# Patient Record
Sex: Female | Born: 1975 | Race: Asian | Hispanic: No | Marital: Married | Smoking: Never smoker
Health system: Southern US, Community
[De-identification: ages and names within clinical notes are randomized; demographics above are authoritative.]

## PROBLEM LIST (undated history)

## (undated) DIAGNOSIS — Z8619 Personal history of other infectious and parasitic diseases: Secondary | ICD-10-CM

## (undated) DIAGNOSIS — R7611 Nonspecific reaction to tuberculin skin test without active tuberculosis: Secondary | ICD-10-CM

## (undated) DIAGNOSIS — A159 Respiratory tuberculosis unspecified: Secondary | ICD-10-CM

## (undated) DIAGNOSIS — R05 Cough: Secondary | ICD-10-CM

## (undated) DIAGNOSIS — R059 Cough, unspecified: Secondary | ICD-10-CM

## (undated) DIAGNOSIS — K759 Inflammatory liver disease, unspecified: Secondary | ICD-10-CM

## (undated) HISTORY — DX: Cough, unspecified: R05.9

## (undated) HISTORY — DX: Nonspecific reaction to tuberculin skin test without active tuberculosis: R76.11

## (undated) HISTORY — DX: Personal history of other infectious and parasitic diseases: Z86.19

## (undated) HISTORY — DX: Respiratory tuberculosis unspecified: A15.9

## (undated) HISTORY — DX: Inflammatory liver disease, unspecified: K75.9

---
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
## 2019-08-29 ENCOUNTER — Ambulatory Visit: Payer: Self-pay

## 2019-09-03 ENCOUNTER — Ambulatory Visit: Payer: Self-pay

## 2019-09-06 ENCOUNTER — Ambulatory Visit: Payer: Managed Care, Other (non HMO)

## 2019-09-12 ENCOUNTER — Ambulatory Visit: Payer: Managed Care, Other (non HMO) | Attending: Internal Medicine

## 2019-09-12 DIAGNOSIS — Z23 Encounter for immunization: Secondary | ICD-10-CM

## 2019-09-12 NOTE — Progress Notes (Signed)
   Covid-19 Vaccination Clinic  Name:  Cassandra Weber    MRN: [id number] DOB: Sep 26, 1975  09/12/2019  [name] was observed post Covid-19 immunization for 15 minutes without incidence. She was provided with Vaccine Information Sheet and instruction to access the V-Safe system.   [name] was instructed to call 911 with any severe reactions post vaccine: Marland Kitchen Difficulty breathing  . Swelling of your face and throat  . A fast heartbeat  . A bad rash all over your body  . Dizziness and weakness    Immunizations Administered    Name Date Dose VIS Date Route   Pfizer COVID-19 Vaccine 09/12/2019 11:58 AM 0.3 mL 07/11/2019 Intramuscular   Manufacturer: [employer], [employer]   Lot: [id number]   NDC: [address]-9

## 2019-10-04 ENCOUNTER — Ambulatory Visit: Payer: Managed Care, Other (non HMO) | Attending: Internal Medicine

## 2019-10-04 DIAGNOSIS — Z23 Encounter for immunization: Secondary | ICD-10-CM

## 2019-10-04 NOTE — Progress Notes (Signed)
   Covid-19 Vaccination Clinic  Name:  Cassandra Weber    MRN: [id number] DOB: 1975-10-13  10/04/2019  [name] was observed post Covid-19 immunization for 15 minutes without incident. She was provided with Vaccine Information Sheet and instruction to access the V-Safe system.   [name] was instructed to call 911 with any severe reactions post vaccine: Marland Kitchen Difficulty breathing  . Swelling of face and throat  . A fast heartbeat  . A bad rash all over body  . Dizziness and weakness   Immunizations Administered    Name Date Dose VIS Date Route   Pfizer COVID-19 Vaccine 10/04/2019  3:26 PM 0.3 mL 07/11/2019 Intramuscular   Manufacturer: [employer], [employer]   Lot: [id number]   NDC: [address]-5

## 2019-12-16 ENCOUNTER — Other Ambulatory Visit: Payer: Self-pay | Admitting: Internal Medicine

## 2019-12-16 ENCOUNTER — Other Ambulatory Visit: Payer: Self-pay | Admitting: General Practice

## 2019-12-16 ENCOUNTER — Ambulatory Visit
Admission: RE | Admit: 2019-12-16 | Discharge: 2019-12-16 | Disposition: A | Discharge status: Home / Self Care | Payer: No Typology Code available for payment source | Source: Ambulatory Visit | Attending: Internal Medicine | Admitting: Internal Medicine

## 2019-12-16 DIAGNOSIS — J40 Bronchitis, not specified as acute or chronic: Secondary | ICD-10-CM

## 2019-12-26 ENCOUNTER — Encounter: Payer: Self-pay | Admitting: Family Medicine

## 2019-12-26 ENCOUNTER — Other Ambulatory Visit: Payer: Self-pay

## 2019-12-26 ENCOUNTER — Ambulatory Visit: Payer: No Typology Code available for payment source | Admitting: Family Medicine

## 2019-12-26 ENCOUNTER — Ambulatory Visit (INDEPENDENT_AMBULATORY_CARE_PROVIDER_SITE_OTHER): Payer: No Typology Code available for payment source | Admitting: Family Medicine

## 2019-12-26 VITALS — BP 82/58 | HR 80 | Temp 98.0°F | Ht 67.0 in | Wt 127.6 lb

## 2019-12-26 DIAGNOSIS — Z131 Encounter for screening for diabetes mellitus: Secondary | ICD-10-CM | POA: Diagnosis not present

## 2019-12-26 DIAGNOSIS — Z8619 Personal history of other infectious and parasitic diseases: Secondary | ICD-10-CM | POA: Diagnosis not present

## 2019-12-26 DIAGNOSIS — Z13220 Encounter for screening for lipoid disorders: Secondary | ICD-10-CM | POA: Diagnosis not present

## 2019-12-26 DIAGNOSIS — J189 Pneumonia, unspecified organism: Secondary | ICD-10-CM

## 2019-12-26 LAB — CBC WITH DIFFERENTIAL/PLATELET
Basophils Absolute: 0 10*3/uL (ref 0.0–0.1)
Basophils Relative: 0.5 % (ref 0.0–3.0)
Eosinophils Absolute: 0.1 10*3/uL (ref 0.0–0.7)
Eosinophils Relative: 1.4 % (ref 0.0–5.0)
HCT: 34.1 % — ABNORMAL LOW (ref 36.0–46.0)
Hemoglobin: 11.6 g/dL — ABNORMAL LOW (ref 12.0–15.0)
Lymphocytes Relative: 33.4 % (ref 12.0–46.0)
Lymphs Abs: 2.1 10*3/uL (ref 0.7–4.0)
MCHC: 33.9 g/dL (ref 30.0–36.0)
MCV: 83.2 fl (ref 78.0–100.0)
Monocytes Absolute: 0.5 10*3/uL (ref 0.1–1.0)
Monocytes Relative: 7.3 % (ref 3.0–12.0)
Neutro Abs: 3.6 10*3/uL (ref 1.4–7.7)
Neutrophils Relative %: 57.4 % (ref 43.0–77.0)
Platelets: 293 10*3/uL (ref 150.0–400.0)
RBC: 4.1 Mil/uL (ref 3.87–5.11)
RDW: 13.7 % (ref 11.5–15.5)
WBC: 6.4 10*3/uL (ref 4.0–10.5)

## 2019-12-26 LAB — LDL CHOLESTEROL, DIRECT: Direct LDL: 108 mg/dL

## 2019-12-26 LAB — COMPREHENSIVE METABOLIC PANEL
ALT: 18 U/L (ref 0–35)
AST: 19 U/L (ref 0–37)
Albumin: 4.3 g/dL (ref 3.5–5.2)
Alkaline Phosphatase: 45 U/L (ref 39–117)
BUN: 13 mg/dL (ref 6–23)
CO2: 29 mEq/L (ref 19–32)
Calcium: 9.2 mg/dL (ref 8.4–10.5)
Chloride: 101 mEq/L (ref 96–112)
Creatinine, Ser: 0.81 mg/dL (ref 0.40–1.20)
GFR: 76.51 mL/min (ref >60.00)
Glucose, Bld: 80 mg/dL (ref 70–99)
Potassium: 4 mEq/L (ref 3.5–5.1)
Sodium: 136 mEq/L (ref 135–145)
Total Bilirubin: 0.4 mg/dL (ref 0.2–1.2)
Total Protein: 7.4 g/dL (ref 6.0–8.3)

## 2019-12-26 LAB — LIPID PANEL
Cholesterol: 188 mg/dL (ref 0–200)
HDL: 43.3 mg/dL (ref >39.00)
NonHDL: 144.57
Total CHOL/HDL Ratio: 4
Triglycerides: 206 mg/dL — ABNORMAL HIGH (ref 0.0–149.0)
VLDL: 41.2 mg/dL — ABNORMAL HIGH (ref 0.0–40.0)

## 2019-12-26 NOTE — Progress Notes (Signed)
Cassandra Weber DOB: 02/18/1976 Encounter date: 12/26/2019  This isa 44 y.o. female who presents to establish care. Chief Complaint  Patient presents with  . Establish Care    History of present illness: Husband is IM doc in [place].  Towards end of April started having cough - during high pollen season, but hasn't had issues with pollen in the past. Wondered if body reacting differently due to getting vaccinated. Difficult to sleep due to cough/need to cough/clear throat. She had xray with [facility]. Husband could hear exp wheeze - right upper lobe. Put on levaquin (mild patchy right  Suprahilar opacity suspicious for bronchopneumonia). Also stressed, taking nexium which she feels she might need to help with reflux which is worse with stress. Has had mild reflux in past. Not sure if it was reflux, pollen, aspiration. Does still have cough; nothing to clear. No more wheeziness. None since weds.   Nexium works well for her. Was taking first thing in the morning, but switched to evening.   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 No infection that she knew of.   Has 49 (boy) and 45 year old (girl)  Had jaundice in 11th grade. Uncertain type hepatitis   Past Medical History:  Diagnosis Date  . Cough    treated by husband-states x-ray was negative  . Hepatitis    as a teenager per patient  . History of chicken pox   . 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 No pertinent surgical history. Not on File Current Meds  Medication Sig  . esomeprazole (NEXIUM) 40 MG capsule Take 40 mg by mouth daily.   Social History   Tobacco Use  . Smoking status: Never Smoker  . Smokeless tobacco: Never Used  Substance Use Topics  . Alcohol use: Never   Family History  Problem Relation Age of Onset  . Diabetes Mother   . Hearing loss Mother   . Hearing loss Father        traumatic injury hearing loss  . Stroke Father 56  . Early death  Maternal Grandfather 66  . Stroke Maternal Grandfather        smoker  . Stroke Paternal Grandfather 41  . Healthy Sister      Review of Systems  Constitutional: Negative for chills, fatigue and fever.  Respiratory: Positive for cough (improved) and shortness of breath (improved). Negative for chest tightness and wheezing.   Cardiovascular: Negative for chest pain, palpitations and leg swelling.    Objective:  BP (!) 82/58 (BP Location: Left Arm, Patient Position: Sitting, Cuff Size: Normal)   Pulse 80   Temp 98 F (36.7 C) (Temporal)   Ht 5\' 7"  (1.702 m)   Wt 127 lb 9.6 oz (57.9 kg)   LMP 12/22/2019 (Exact Date)   BMI 19.98 kg/m   Weight: 127 lb 9.6 oz (57.9 kg)   BP Readings from Last 3 Encounters:  12/26/19 (!) 82/58   Wt Readings from Last 3 Encounters:  12/26/19 127 lb 9.6 oz (57.9 kg)    Physical Exam Constitutional:      General: She is not in acute distress.    Appearance: She is well-developed.  Cardiovascular:     Rate and Rhythm: Normal rate and regular rhythm.     Heart sounds: Normal heart sounds. No murmur. No friction rub.  Pulmonary:     Effort: Pulmonary effort is normal. No respiratory distress.  Breath sounds: Normal breath sounds. No wheezing or rales.  Musculoskeletal:     Right lower leg: No edema.     Left lower leg: No edema.  Neurological:     Mental Status: She is alert and oriented to person, place, and time.  Psychiatric:        Behavior: Behavior normal.     Assessment/Plan:  1. History of hepatitis Will check hep panel. Further eval pending results. - Hepatitis c antibody (reflex); Future - Hepatitis B Core AB, Total; Future - Hepatitis B surface antibody,qualitative; Future - Hepatitis B surface antibody,qualitative - Hepatitis B Core AB, Total - Hepatitis c antibody (reflex)  2. Pneumonia of right lung due to infectious organism, unspecified part of lung Clinically has resolved. Consider repeat cxr at future visit to  ensure normalization. - CBC with Differential/Platelet; Future - CBC with Differential/Platelet  3. Lipid screening - Lipid panel; Future - Lipid panel  4. Screening for diabetes mellitus - Comprehensive metabolic panel; Future - Comprehensive metabolic panel  Return in about 3 months (around 03/27/2020) for physical exam.  Micheline Rough, MD

## 2019-12-27 LAB — HEPATITIS C ANTIBODY (REFLEX): HCV Ab: 0.1 s/co ratio (ref 0.0–0.9)

## 2019-12-27 LAB — HCV COMMENT:

## 2019-12-27 LAB — HEPATITIS B SURFACE ANTIBODY,QUALITATIVE: Hep B Surface Ab, Qual: NONREACTIVE

## 2019-12-27 LAB — HEPATITIS B CORE ANTIBODY, TOTAL: Hep B Core Total Ab: NEGATIVE

## 2020-03-29 ENCOUNTER — Ambulatory Visit (INDEPENDENT_AMBULATORY_CARE_PROVIDER_SITE_OTHER): Payer: No Typology Code available for payment source | Admitting: Family Medicine

## 2020-03-29 ENCOUNTER — Other Ambulatory Visit: Payer: Self-pay

## 2020-03-29 ENCOUNTER — Encounter: Payer: Self-pay | Admitting: Family Medicine

## 2020-03-29 VITALS — BP 100/60 | HR 74 | Temp 98.6°F | Ht 65.5 in | Wt 129.9 lb

## 2020-03-29 DIAGNOSIS — Z0000 Encounter for general adult medical examination without abnormal findings: Secondary | ICD-10-CM | POA: Diagnosis not present

## 2020-03-29 DIAGNOSIS — B079 Viral wart, unspecified: Secondary | ICD-10-CM

## 2020-03-29 DIAGNOSIS — Z23 Encounter for immunization: Secondary | ICD-10-CM | POA: Diagnosis not present

## 2020-03-29 NOTE — Progress Notes (Signed)
Cassandra Weber DOB: [birth year] Encounter date: 03/29/2020  This is a 44 y.o. female who presents for complete physical   History of present illness/Additional concerns: Thinks last tetanus shot was end of 2009.  Overall feeling well.  She is due for Pap smear, but started menstrual cycle, so would like to defer this today.  Thinks last Pap smear was within the last 5 years, but is not sure.  Previous Pap smears were normal.  1 sexual partner.  She does get migraines, typically associated with her menstrual cycle.  Used to be more severe, but is doing better since getting YAG procedure.  She does typically get an aura the last for 30 minutes, and a prolonged headache, but does not take medication for this and is able to function even with the headache.  No longer gets nausea or vomiting with her migraines.  Past Medical History:  Diagnosis Date  . Cough    treated by husband-states x-ray was negative  . Hepatitis    as a teenager per patient  . History of chicken pox   . 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 No Known Allergies Current Meds  Medication Sig  . esomeprazole (NEXIUM) 40 MG capsule Take 40 mg by mouth daily.   Social History   Tobacco Use  . Smoking status: Never Smoker  . Smokeless tobacco: Never Used  Substance Use Topics  . Alcohol use: Never   Family History  Problem Relation Age of Onset  . Diabetes Mother   . Hearing loss Mother   . Hearing loss Father        traumatic injury hearing loss  . Stroke Father 16  . Early death Maternal Grandfather 55  . Stroke Maternal Grandfather        smoker  . Stroke Paternal Grandfather 50  . Healthy Sister      Review of Systems  Constitutional: Negative for activity change, appetite change, chills, fatigue, fever and unexpected weight change.  HENT: Negative for congestion, ear pain, hearing loss, sinus pressure, sinus pain, sore throat and trouble swallowing.   Eyes: Negative for  pain and visual disturbance.  Respiratory: Negative for cough, chest tightness, shortness of breath and wheezing.   Cardiovascular: Negative for chest pain, palpitations and leg swelling.  Gastrointestinal: Negative for abdominal pain, blood in stool, constipation, diarrhea, nausea and vomiting.  Genitourinary: Negative for difficulty urinating and menstrual problem.  Musculoskeletal: Negative for arthralgias and back pain.  Skin: Negative for rash.  Neurological: Negative for dizziness, weakness, numbness and headaches.  Hematological: Negative for adenopathy. Does not bruise/bleed easily.  Psychiatric/Behavioral: Negative for sleep disturbance and suicidal ideas. The patient is not nervous/anxious.     CBC:  Lab Results  Component Value Date   WBC 6.4 12/26/2019   HGB 11.6 (L) 12/26/2019   HCT 34.1 (L) 12/26/2019   MCHC 33.9 12/26/2019   RDW 13.7 12/26/2019   PLT 293.0 12/26/2019   CMP: Lab Results  Component Value Date   NA 136 12/26/2019   K 4.0 12/26/2019   CL 101 12/26/2019   CO2 29 12/26/2019   GLUCOSE 80 12/26/2019   BUN 13 12/26/2019   CREATININE 0.81 12/26/2019   CALCIUM 9.2 12/26/2019   PROT 7.4 12/26/2019   BILITOT 0.4 12/26/2019   ALKPHOS 45 12/26/2019   ALT 18 12/26/2019   AST 19 12/26/2019   LIPID: Lab Results  Component Value Date   CHOL 188 12/26/2019  TRIG 206.0 (H) 12/26/2019   HDL 43.30 12/26/2019    Objective:  BP 100/60 (BP Location: Left Arm, Patient Position: Sitting, Cuff Size: Normal)   Pulse 74   Temp 98.6 F (37 C) (Oral)   Ht 5' 5.5" (1.664 m)   Wt 129 lb 14.4 oz (58.9 kg)   LMP 03/26/2020 (Exact Date)   BMI 21.29 kg/m   Weight: 129 lb 14.4 oz (58.9 kg)   BP Readings from Last 3 Encounters:  03/29/20 100/60  12/26/19 (!) 82/58   Wt Readings from Last 3 Encounters:  03/29/20 129 lb 14.4 oz (58.9 kg)  12/26/19 127 lb 9.6 oz (57.9 kg)    Physical Exam Constitutional:      General: She is not in acute distress.     Appearance: She is well-developed.  HENT:     Head: Normocephalic and atraumatic.     Right Ear: External ear normal.     Left Ear: External ear normal.     Mouth/Throat:     Pharynx: No oropharyngeal exudate.  Eyes:     Conjunctiva/sclera: Conjunctivae normal.     Pupils: Pupils are equal, round, and reactive to light.  Neck:     Thyroid: No thyromegaly.  Cardiovascular:     Rate and Rhythm: Normal rate and regular rhythm.     Heart sounds: Normal heart sounds. No murmur heard.  No friction rub. No gallop.   Pulmonary:     Effort: Pulmonary effort is normal.     Breath sounds: Normal breath sounds.  Abdominal:     General: Bowel sounds are normal. There is no distension.     Palpations: Abdomen is soft. There is no mass.     Tenderness: There is no abdominal tenderness. There is no guarding.     Hernia: No hernia is present.  Musculoskeletal:        General: No tenderness or deformity. Normal range of motion.     Cervical back: Normal range of motion and neck supple.  Lymphadenopathy:     Cervical: No cervical adenopathy.  Skin:    General: Skin is warm and dry.     Findings: No rash.     Comments: Plantar wound noted left ball of foot.  Wart noted index finger left hand x2, middle finger and ring finger on right hand.  Neurological:     Mental Status: She is alert and oriented to person, place, and time.     Deep Tendon Reflexes: Reflexes normal.     Reflex Scores:      Tricep reflexes are 2+ on the right side and 2+ on the left side.      Bicep reflexes are 2+ on the right side and 2+ on the left side.      Brachioradialis reflexes are 2+ on the right side and 2+ on the left side.      Patellar reflexes are 2+ on the right side and 2+ on the left side. Psychiatric:        Speech: Speech normal.        Behavior: Behavior normal.        Thought Content: Thought content normal.   Cryotherapy Procedure Note  Pre-operative Diagnosis: plantar wart, common  wart  Post-operative Diagnosis: same  Locations: Plantar wart left ball of foot, 4 mm in width, wart index finger left hand 3 mm, and 2 mm, wart on third and fourth fingers right hand 1 mm.   Procedure Details  Patient informed of  the risks, including bleeding and infection, and benefits of the  procedure and Verbal informed consent obtained. The lesion and surrounding area was cleansed with alcohol and lesion was shaved superficially using a dermablade.  Three cycles of liquid nitrogen applied to the area with 1-73mm perimeter with pause between cycles. Patient tolerated procedure well.  Complications: none.  Plan: 1. Discussed that there may be blister formation. Keep wound clean, dry. OK to apply antibiotic ointment if needed.  2. Warning signs of infection were reviewed.   3. Return in 2 weeks for re-treatment if lesion persists.   Assessment/Plan: Health Maintenance Due  Topic Date Due  . PAP SMEAR-Modifier  07/31/2017  . INFLUENZA VACCINE  02/29/2020   Health Maintenance reviewed - had mammogram; requesting copy of this.  1. Preventative health care She hikes on a regular basis, and eats healthy overall.  Advised she can follow-up for Pap when able, likely is within window of normal follow-up to wait until next year given recollection of normal prior Paps. - Tdap vaccine greater than or equal to 7yo IM  2. Need for Tdap vaccination Completed in office today  3. Viral warts, unspecified type Cryotherapy completed in office today, see above. - PR DESTRUCTION BENIGN LESIONS UP TO 14    Return for can set up pap if desired when able; next physical in 1 year. Theodis Shove, MD

## 2020-03-29 NOTE — Patient Instructions (Addendum)
If you need another wart treatment in 2-3 weeks, let me know. I can add you in at any time for this.

## 2020-04-26 ENCOUNTER — Encounter: Payer: Self-pay | Admitting: Family Medicine

## 2020-04-27 NOTE — Telephone Encounter (Signed)
Spoke with the pt and informed her of the message below.  Patient stated she would prefer to see Dr Hassan Rowan and I advised she arrive Wednesday at 4pm.  Patient was given the number to call [phone] when she arrives and to park in the back parking lot.  Message sent to [facility] to add the time slot.

## 2020-04-28 ENCOUNTER — Ambulatory Visit (INDEPENDENT_AMBULATORY_CARE_PROVIDER_SITE_OTHER): Payer: No Typology Code available for payment source | Admitting: Family Medicine

## 2020-04-28 ENCOUNTER — Other Ambulatory Visit: Payer: Self-pay

## 2020-04-28 DIAGNOSIS — J189 Pneumonia, unspecified organism: Secondary | ICD-10-CM

## 2020-04-28 NOTE — Progress Notes (Signed)
Cassandra Weber DOB: 01/18/1976 Encounter date: 04/28/2020  This is a 44 y.o. female who presents with Chief Complaint  Patient presents with  . Cough    History of present illness: April 23rd was when she started coughing. Seemed to coincide with seasonal change, etc.   Was using heater in shed at that time. Started with throat clearing. Intermittent post nasal dripping. Then would have to cough a little to feel better. Then started to hear wheeze on right when laying in bed; just when laying on right side. Progressively wheeze became more and then ended up getting xray in 5/19 and found to have pneumonia. Had negative covid, TB, etc. By time she had visit here she had been treated with abx and was doing well.   Did fine through last few months, then last week. Studio was cool in morning; turned on heater and started with clearing throat. Not coughing continuously. Doesn't "have" to cough, but noting wheeze, esp at night. Can hear self wheezing. Takes nexium in morning regularly. Usually all done eating at 7:30. Has added pepcid at 8pm which has helped. Not waking because of heartburn or coughing. Every once in awhile clears throat, but not as bad as when it was in spring. No fevers.     No Known Allergies No outpatient medications have been marked as taking for the 04/28/20 encounter (Office Visit) with Wynn Banker, MD.    Review of Systems  Constitutional: Negative for chills, fatigue and fever.  HENT: Negative for congestion, postnasal drip and sore throat.   Respiratory: Positive for cough (see hpi) and wheezing. Negative for chest tightness and shortness of breath.   Cardiovascular: Negative for chest pain, palpitations and leg swelling.  Gastrointestinal: Negative for abdominal pain, diarrhea, nausea and vomiting.    Objective:  There were no vitals taken for this visit.      BP Readings from Last 3 Encounters:  03/29/20 100/60  12/26/19 (!) 82/58   Wt  Readings from Last 3 Encounters:  03/29/20 129 lb 14.4 oz (58.9 kg)  12/26/19 127 lb 9.6 oz (57.9 kg)    Physical Exam Constitutional:      General: She is not in acute distress.    Appearance: She is well-developed.  HENT:     Head: Normocephalic and atraumatic.     Right Ear: Tympanic membrane, ear canal and external ear normal.     Left Ear: Tympanic membrane, ear canal and external ear normal.     Mouth/Throat:     Mouth: Mucous membranes are moist.     Pharynx: Oropharynx is clear.     Tonsils: No tonsillar exudate.  Cardiovascular:     Rate and Rhythm: Normal rate and regular rhythm.     Heart sounds: Normal heart sounds. No murmur heard.  No friction rub.  Pulmonary:     Effort: Pulmonary effort is normal. No respiratory distress.     Breath sounds: Examination of the right-upper field reveals wheezing. Examination of the right-lower field reveals rales. Wheezing and rales present. No rhonchi.  Musculoskeletal:     Right lower leg: No edema.     Left lower leg: No edema.  Neurological:     Mental Status: She is alert and oriented to person, place, and time.  Psychiatric:        Behavior: Behavior normal.     Assessment/Plan  1. Pneumonia of right lower lobe due to infectious organism Recurrent from 5 months ago which is unusual in this otherwise  healthy young female. Will get cxr for comparison to previous. On exam this pneumonia is RLL, but she does have significant expiratory wheeze anterior RUL. She is feeling well otherwise. Concern for reflux triggering this. Continue with nexium. We discussed elevating head of bed. After discussion with husband (IM doc) and delay of cxr report with worsening cough; we added zithromax as well as prednisone and prn albuterol inhaler. Discussed holding prednisone if albuterol helping in order to avoid triggers for worsening reflux. Follow up pending sputum culture. Consider CT, consider GI referral/eval. - CBC with  Differential/Platelet; Future - DG Chest 2 View; Future - CBC with Differential/Platelet   Return for pending xray.    Theodis Shove, MD

## 2020-04-29 ENCOUNTER — Other Ambulatory Visit: Payer: No Typology Code available for payment source

## 2020-04-29 ENCOUNTER — Other Ambulatory Visit: Payer: Self-pay

## 2020-04-29 ENCOUNTER — Telehealth: Payer: Self-pay | Admitting: Family Medicine

## 2020-04-29 ENCOUNTER — Ambulatory Visit (INDEPENDENT_AMBULATORY_CARE_PROVIDER_SITE_OTHER): Payer: No Typology Code available for payment source

## 2020-04-29 DIAGNOSIS — J189 Pneumonia, unspecified organism: Secondary | ICD-10-CM

## 2020-04-29 LAB — CBC WITH DIFFERENTIAL/PLATELET
Absolute Monocytes: 737 cells/uL (ref 200–950)
Basophils Absolute: 38 cells/uL (ref 0–200)
Basophils Relative: 0.5 %
Eosinophils Absolute: 99 cells/uL (ref 15–500)
Eosinophils Relative: 1.3 %
HCT: 33.9 % — ABNORMAL LOW (ref 35.0–45.0)
Hemoglobin: 10.9 g/dL — ABNORMAL LOW (ref 11.7–15.5)
Lymphs Abs: 2455 cells/uL (ref 850–3900)
MCH: 26.6 pg — ABNORMAL LOW (ref 27.0–33.0)
MCHC: 32.2 g/dL (ref 32.0–36.0)
MCV: 82.7 fL (ref 80.0–100.0)
MPV: 10.5 fL (ref 7.5–12.5)
Monocytes Relative: 9.7 %
Neutro Abs: 4271 cells/uL (ref 1500–7800)
Neutrophils Relative %: 56.2 %
Platelets: 286 10*3/uL (ref 140–400)
RBC: 4.1 10*6/uL (ref 3.80–5.10)
RDW: 13.5 % (ref 11.0–15.0)
Total Lymphocyte: 32.3 %
WBC: 7.6 10*3/uL (ref 3.8–10.8)

## 2020-04-29 NOTE — Telephone Encounter (Signed)
Pts spouse (Dr. Leeroy Bock) is calling to see if Dr. Hassan Rowan would call him to go over the findings of the xray that was done on today (04/29/2020).  He is aware that the provider is not in the office today and will return on Friday 04/30/2020.

## 2020-04-30 ENCOUNTER — Other Ambulatory Visit: Payer: Self-pay | Admitting: Family Medicine

## 2020-04-30 ENCOUNTER — Telehealth: Payer: Self-pay | Admitting: Family Medicine

## 2020-04-30 ENCOUNTER — Encounter: Payer: Self-pay | Admitting: Family Medicine

## 2020-04-30 DIAGNOSIS — J189 Pneumonia, unspecified organism: Secondary | ICD-10-CM

## 2020-04-30 MED ORDER — AZITHROMYCIN 250 MG PO TABS: ORAL_TABLET | ORAL | 0 refills | Status: DC

## 2020-04-30 NOTE — Telephone Encounter (Signed)
Ok got it thanks

## 2020-04-30 NOTE — Telephone Encounter (Signed)
Noted  

## 2020-04-30 NOTE — Telephone Encounter (Signed)
pt would like the results of the x-ray of her chest  [phone]

## 2020-04-30 NOTE — Telephone Encounter (Signed)
I dont see that xray is resulted?

## 2020-04-30 NOTE — Telephone Encounter (Signed)
Spoke with Cassandra Weber in the reading room at [facility] [phone]).  She stated they are a little behind on outpatient studies and she will have a doctor read this in about 15 minutes.  Message sent to PCP.

## 2020-04-30 NOTE — Telephone Encounter (Signed)
See result note; I was able to also speak with husband

## 2020-05-01 MED ORDER — ALBUTEROL SULFATE HFA 108 (90 BASE) MCG/ACT IN AERS: 2.0000 | INHALATION_SPRAY | Freq: Four times a day (QID) | RESPIRATORY_TRACT | 0 refills | Status: DC | PRN

## 2020-05-01 MED ORDER — AEROCHAMBER PLUS MISC: 0 refills | Status: DC

## 2020-05-01 MED ORDER — PREDNISONE 20 MG PO TABS: 40.0000 mg | ORAL_TABLET | Freq: Every day | ORAL | 0 refills | Status: DC

## 2020-05-03 ENCOUNTER — Encounter: Payer: Self-pay | Admitting: Family Medicine

## 2020-05-03 NOTE — Telephone Encounter (Signed)
Pt returned the call to the office and would like to have a return call 

## 2020-05-03 NOTE — Telephone Encounter (Signed)
The patient called back wanting to know if the CT has been approved.  Please advise

## 2020-05-03 NOTE — Telephone Encounter (Signed)
See prior note

## 2020-05-03 NOTE — Telephone Encounter (Signed)
See results note. 

## 2020-07-16 ENCOUNTER — Other Ambulatory Visit: Payer: Self-pay | Admitting: Obstetrics and Gynecology

## 2020-07-16 ENCOUNTER — Other Ambulatory Visit: Payer: Self-pay

## 2020-07-16 ENCOUNTER — Ambulatory Visit
Admission: RE | Admit: 2020-07-16 | Discharge: 2020-07-16 | Disposition: A | Discharge status: Home / Self Care | Payer: Self-pay | Source: Ambulatory Visit | Attending: Obstetrics and Gynecology | Admitting: Obstetrics and Gynecology

## 2020-07-16 DIAGNOSIS — Z0000 Encounter for general adult medical examination without abnormal findings: Secondary | ICD-10-CM

## 2020-09-03 ENCOUNTER — Other Ambulatory Visit: Payer: Self-pay

## 2020-09-03 ENCOUNTER — Encounter: Payer: Self-pay | Admitting: Family Medicine

## 2020-09-03 ENCOUNTER — Ambulatory Visit (INDEPENDENT_AMBULATORY_CARE_PROVIDER_SITE_OTHER): Payer: No Typology Code available for payment source | Admitting: Family Medicine

## 2020-09-03 VITALS — BP 94/64 | HR 40 | Temp 98.0°F | Ht 65.5 in | Wt 127.1 lb

## 2020-09-03 DIAGNOSIS — R9389 Abnormal findings on diagnostic imaging of other specified body structures: Secondary | ICD-10-CM | POA: Diagnosis not present

## 2020-09-03 DIAGNOSIS — F419 Anxiety disorder, unspecified: Secondary | ICD-10-CM

## 2020-09-03 DIAGNOSIS — A150 Tuberculosis of lung: Secondary | ICD-10-CM | POA: Diagnosis not present

## 2020-09-03 MED ORDER — CLONAZEPAM 0.5 MG PO TABS: 0.2500 mg | ORAL_TABLET | Freq: Every day | ORAL | 0 refills | Status: DC | PRN

## 2020-09-03 NOTE — Progress Notes (Signed)
Jasslyn Lesch DOB: 1975-08-15 Encounter date: 09/03/2020  This is a 45 y.o. female who presents with Chief Complaint  Patient presents with  . Follow-up    History of present illness: History of recurrent pneumonia: ended up seeing pulmonologist after our last visit - she was already doing better on azithromycin at that time. CT scan chest done and showed RUL ground glass appearance. Did bronch after this (October) - within a day smear came back positive for TB and culture was positive. Started on TB meds and completed intensive phase - now on step down; has 2 months more of treatment. Initially treatment was rough; she had very high temps up to 108. Was bad after bronch as well - vomiting and just felt horrible afterwards. Very sick on medications for first week - tired, felt sick. Hadn't really been sick in past; this was different for her. Feels that being sick increased anxiety. Just hadn't been in position before where other people had to do things for her and she wasn't able to manage everything with family, kids, etc.   Had thyroid studies done through health dept - mild elevation. 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   Anxiety level increased with all of the above. Sometimes gets a palpitation. Can get jolted awake from sleep - multiple times before she settles down to sleep. Unable to do her pottery due to cold weather. Thinks when she can restart this it will help with overall mood. Has taken to painting and hand crafting but it is not the same for her.  On rifampin, inh and b6 currently. Adjustments made per ID who is following.  Palpitations more in evening usually when going to bed.   No Known Allergies Current Meds  Medication Sig  . PRESCRIPTION MEDICATION TB treatment    Review of Systems  Constitutional: Negative for chills, fatigue and fever.  Respiratory: Negative for cough, chest tightness, shortness of breath and wheezing.   Cardiovascular:  Negative for chest pain, palpitations and leg swelling.  Psychiatric/Behavioral: Positive for decreased concentration and sleep disturbance. The patient is nervous/anxious.     Objective:  BP 94/64 (BP Location: Left Arm, Patient Position: Sitting, Cuff Size: Normal)   Pulse (!) 40   Temp 98 F (36.7 C) (Oral)   Ht 5' 5.5" (1.664 m)   Wt 127 lb 1.6 oz (57.7 kg)   SpO2 99%   BMI 20.83 kg/m   Weight: 127 lb 1.6 oz (57.7 kg)   BP Readings from Last 3 Encounters:  09/03/20 94/64  03/29/20 100/60  12/26/19 (!) 82/58   Wt Readings from Last 3 Encounters:  09/03/20 127 lb 1.6 oz (57.7 kg)  03/29/20 129 lb 14.4 oz (58.9 kg)  12/26/19 127 lb 9.6 oz (57.9 kg)    Physical Exam Constitutional:      General: She is not in acute distress.    Appearance: She is well-developed.  Cardiovascular:     Rate and Rhythm: Normal rate and regular rhythm.     Heart sounds: Normal heart sounds. No murmur heard. No friction rub.  Pulmonary:     Effort: Pulmonary effort is normal. No respiratory distress.     Breath sounds: Normal breath sounds. No wheezing or rales.  Musculoskeletal:     Right lower leg: No edema.     Left lower leg: No edema.  Neurological:     Mental Status: She is alert and oriented to person, place, and time.  Psychiatric:  Attention and Perception: Attention normal.        Mood and Affect: Mood normal.        Behavior: Behavior normal.     Comments: She has elements of anxiety which are quite severe in her day; mostly in evening.  These are disruptive for sleep.  She does have good family support.  She looks forward to improved weather in the spring, as well as be able to do pottery again.  Sad mood is better now that she is feeling better.      Assessment/Plan  1. TB (pulmonary tuberculosis) She is feeling better overall now that she is in a stepdown phase of treatment for TB.  Breathing is much better, energy level is better.  She is following regularly  with infectious disease and tolerating medications okay.  2. Abnormal imaging of thyroid We will get an ultrasound to further evaluate nodular appearing thyroid commented on during a CT of her chest.  She did have thyroid studies (TSH and T4) which were normal.  If ultrasound looks normal, we discussed postponing referral to endocrinology and planning on just rechecking thyroid levels. - Ambulatory referral to Endocrinology - US THYROID; Future  3. Anxiety Anxiety is more in the evening, and that patient and provider feels that anxiety will improve quickly now that she is feeling better, when weather improves, and then she is able to get back to pottery (which is therapeutic for her).  She has done well with just half tablet of klonopin on occasion, so I have written this for her to have on hand. She will let me know if any worsening of anxiety or not improving as we expect.   - clonazePAM (KLONOPIN) 0.5 MG tablet; Take 0.5-1 tablets (0.25-0.5 mg total) by mouth daily as needed for anxiety.  Dispense: 30 tablet; Refill: 0   Return if symptoms worsen or fail to improve.    Theodis Shove, MD

## 2020-09-04 MED ORDER — ISONIAZID 300 MG PO TABS: 300.0000 mg | ORAL_TABLET | Freq: Every day | ORAL | Status: DC

## 2020-09-04 MED ORDER — PYRIDOXINE HCL 25 MG PO TABS: 25.0000 mg | ORAL_TABLET | Freq: Every day | ORAL | Status: DC

## 2020-09-04 MED ORDER — RIFAMPIN 300 MG PO CAPS: 600.0000 mg | ORAL_CAPSULE | Freq: Every day | ORAL | Status: DC

## 2020-09-16 ENCOUNTER — Ambulatory Visit
Admission: RE | Admit: 2020-09-16 | Discharge: 2020-09-16 | Disposition: A | Discharge status: Home / Self Care | Payer: No Typology Code available for payment source | Source: Ambulatory Visit | Attending: Family Medicine | Admitting: Family Medicine

## 2020-09-16 ENCOUNTER — Other Ambulatory Visit: Payer: Self-pay

## 2020-09-16 DIAGNOSIS — R9389 Abnormal findings on diagnostic imaging of other specified body structures: Secondary | ICD-10-CM

## 2020-09-20 ENCOUNTER — Other Ambulatory Visit: Payer: Self-pay | Admitting: Family Medicine

## 2020-09-20 ENCOUNTER — Telehealth: Payer: Self-pay | Admitting: Family Medicine

## 2020-09-20 DIAGNOSIS — E041 Nontoxic single thyroid nodule: Secondary | ICD-10-CM

## 2020-09-20 NOTE — Telephone Encounter (Signed)
Pt returned the call to the office. 

## 2020-09-20 NOTE — Telephone Encounter (Signed)
See results notes. 

## 2020-10-07 ENCOUNTER — Other Ambulatory Visit: Payer: Self-pay

## 2020-10-11 ENCOUNTER — Encounter: Payer: Self-pay | Admitting: Endocrinology

## 2020-10-11 ENCOUNTER — Encounter: Payer: Self-pay | Admitting: Family Medicine

## 2020-10-11 ENCOUNTER — Other Ambulatory Visit: Payer: Self-pay

## 2020-10-11 ENCOUNTER — Ambulatory Visit: Payer: No Typology Code available for payment source | Admitting: Endocrinology

## 2020-10-11 DIAGNOSIS — E042 Nontoxic multinodular goiter: Secondary | ICD-10-CM | POA: Diagnosis not present

## 2020-10-11 NOTE — Patient Instructions (Addendum)
Please send [place] the thyroid blood test results, through my chart.   You should have the ultrasound rechecked in approx 1 year. I would be happy to see you back here as needed.

## 2020-10-11 NOTE — Progress Notes (Signed)
Subjective:    Patient ID: Cassandra Weber, female    DOB: Aug 16, 1975, 45 y.o.   MRN: [id number]  HPI Pt is referred by Dr Hassan Rowan, for nodular thyroid.  Pt was noted to have a goiter in 2021.  she is unaware of ever having had thyroid problems in the past.  she has no h/o XRT or surgery to the neck.  Sob is resolved.  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  Social History   Socioeconomic History   Marital status: Married    Spouse name: Not on file   Number of children: Not on file   Years of education: Not on file   Highest education level: Not on file  Occupational History   Not on file  Tobacco Use   Smoking status: Never Smoker   Smokeless tobacco: Never Used  Vaping Use   Vaping Use: Never used  Substance and Sexual Activity   Alcohol use: Never   Drug use: Never   Sexual activity: Yes  Other Topics Concern   Not on file  Social History Narrative   Not on file   Social Determinants of Health   Financial Resource Strain: Not on file  Food Insecurity: Not on file  Transportation Needs: Not on file  Physical Activity: Not on file  Stress: Not on file  Social Connections: Not on file  Intimate Partner Violence: Not on file    Current Outpatient Medications on File Prior to Visit  Medication Sig Dispense Refill   clonazePAM (KLONOPIN) 0.5 MG tablet Take 0.5-1 tablets (0.25-0.5 mg total) by mouth daily as needed for anxiety. 30 tablet 0   isoniazid (NYDRAZID) 300 MG tablet Take 1 tablet (300 mg total) by mouth daily.     pyridOXINE (VITAMIN B-6) 25 MG tablet Take 1 tablet (25 mg total) by mouth daily.     rifampin (RIFADIN) 300 MG capsule Take 2 capsules (600 mg  total) by mouth daily.     No current facility-administered medications on file prior to visit.    No Known Allergies  Family History  Problem Relation Age of Onset   Diabetes Mother    Hearing loss Mother    Hearing loss Father        traumatic injury hearing loss   Stroke Father 83   Early death Maternal Grandfather 52   Stroke Maternal Grandfather        smoker   Stroke Paternal Grandfather 26   Healthy Sister    Thyroid disease Neg Hx     BP 100/60 (BP Location: Right Arm, Patient Position: Sitting, Cuff Size: Normal)    Pulse 72    Ht 5\' 6"  (1.676 m)    Wt 127 lb 3.2 oz (57.7 kg)    SpO2 99%    BMI 20.53 kg/m    Review of Systems Denies weight change, hoarseness, neck pain, flushing, palpitations, and cold intolerance.     Objective:   Physical Exam VITAL SIGNS:  See vs page GENERAL: no distress NECK: There is no palpable thyroid enlargement.  No thyroid nodule is palpable.  No palpable lymphadenopathy at the anterior  neck.    [place]: MNG with LUL nodule (labeled 1, 1.9 cm) meets criteria for 1 year ultrasound.    TSH=4.5  I have reviewed outside records, and summarized: Pt was noted to have MNG, and referred here.  She had w/u by pulm, and TB was found.  MNG was incidentally noted on CT.      Assessment & Plan:  MNG, new to me.   Hypothyroidism: uncontrolled.  In view of the above, I rx'ed synthroid.    Patient Instructions  Please send [place] the thyroid blood test results, through my chart.   You should have the ultrasound rechecked in approx 1 year. I would be happy to see you back here as needed.

## 2020-10-13 MED ORDER — LEVOTHYROXINE SODIUM 25 MCG PO TABS: 25.0000 ug | ORAL_TABLET | Freq: Every day | ORAL | 3 refills | Status: DC

## 2020-11-09 ENCOUNTER — Other Ambulatory Visit: Payer: Self-pay

## 2020-11-09 ENCOUNTER — Other Ambulatory Visit: Payer: Self-pay | Admitting: Obstetrics and Gynecology

## 2020-11-09 ENCOUNTER — Ambulatory Visit
Admission: RE | Admit: 2020-11-09 | Discharge: 2020-11-09 | Disposition: A | Discharge status: Home / Self Care | Payer: No Typology Code available for payment source | Source: Ambulatory Visit | Attending: Obstetrics and Gynecology | Admitting: Obstetrics and Gynecology

## 2020-11-09 DIAGNOSIS — Z0000 Encounter for general adult medical examination without abnormal findings: Secondary | ICD-10-CM

## 2021-01-13 ENCOUNTER — Encounter: Payer: Self-pay | Admitting: Family Medicine

## 2021-03-29 ENCOUNTER — Other Ambulatory Visit: Payer: Self-pay

## 2021-03-30 ENCOUNTER — Ambulatory Visit (INDEPENDENT_AMBULATORY_CARE_PROVIDER_SITE_OTHER): Payer: No Typology Code available for payment source | Admitting: Family Medicine

## 2021-03-30 ENCOUNTER — Encounter: Payer: Self-pay | Admitting: Family Medicine

## 2021-03-30 ENCOUNTER — Other Ambulatory Visit (HOSPITAL_COMMUNITY)
Admission: RE | Admit: 2021-03-30 | Discharge: 2021-03-30 | Disposition: A | Discharge status: Home / Self Care | Payer: No Typology Code available for payment source | Source: Ambulatory Visit | Attending: Family Medicine | Admitting: Family Medicine

## 2021-03-30 VITALS — BP 90/60 | HR 64 | Temp 97.9°F | Ht 65.25 in | Wt 126.5 lb

## 2021-03-30 DIAGNOSIS — Z131 Encounter for screening for diabetes mellitus: Secondary | ICD-10-CM

## 2021-03-30 DIAGNOSIS — E039 Hypothyroidism, unspecified: Secondary | ICD-10-CM | POA: Diagnosis not present

## 2021-03-30 DIAGNOSIS — Z124 Encounter for screening for malignant neoplasm of cervix: Secondary | ICD-10-CM

## 2021-03-30 DIAGNOSIS — Z0000 Encounter for general adult medical examination without abnormal findings: Secondary | ICD-10-CM | POA: Diagnosis not present

## 2021-03-30 DIAGNOSIS — Z13220 Encounter for screening for lipoid disorders: Secondary | ICD-10-CM

## 2021-03-30 LAB — LIPID PANEL
Cholesterol: 168 mg/dL (ref 0–200)
HDL: 52.4 mg/dL (ref >39.00)
LDL Cholesterol: 92 mg/dL (ref 0–99)
NonHDL: 115.74
Total CHOL/HDL Ratio: 3
Triglycerides: 119 mg/dL (ref 0.0–149.0)
VLDL: 23.8 mg/dL (ref 0.0–40.0)

## 2021-03-30 LAB — CBC WITH DIFFERENTIAL/PLATELET
Basophils Absolute: 0 10*3/uL (ref 0.0–0.1)
Basophils Relative: 0.5 % (ref 0.0–3.0)
Eosinophils Absolute: 0 10*3/uL (ref 0.0–0.7)
Eosinophils Relative: 0.9 % (ref 0.0–5.0)
HCT: 36.7 % (ref 36.0–46.0)
Hemoglobin: 12.1 g/dL (ref 12.0–15.0)
Lymphocytes Relative: 27.1 % (ref 12.0–46.0)
Lymphs Abs: 1.4 10*3/uL (ref 0.7–4.0)
MCHC: 32.9 g/dL (ref 30.0–36.0)
MCV: 86.2 fl (ref 78.0–100.0)
Monocytes Absolute: 0.6 10*3/uL (ref 0.1–1.0)
Monocytes Relative: 11.7 % (ref 3.0–12.0)
Neutro Abs: 3.2 10*3/uL (ref 1.4–7.7)
Neutrophils Relative %: 59.8 % (ref 43.0–77.0)
Platelets: 224 10*3/uL (ref 150.0–400.0)
RBC: 4.26 Mil/uL (ref 3.87–5.11)
RDW: 13.9 % (ref 11.5–15.5)
WBC: 5.3 10*3/uL (ref 4.0–10.5)

## 2021-03-30 LAB — COMPREHENSIVE METABOLIC PANEL
ALT: 10 U/L (ref 0–35)
AST: 17 U/L (ref 0–37)
Albumin: 4.3 g/dL (ref 3.5–5.2)
Alkaline Phosphatase: 38 U/L — ABNORMAL LOW (ref 39–117)
BUN: 12 mg/dL (ref 6–23)
CO2: 28 mEq/L (ref 19–32)
Calcium: 9.3 mg/dL (ref 8.4–10.5)
Chloride: 100 mEq/L (ref 96–112)
Creatinine, Ser: 0.84 mg/dL (ref 0.40–1.20)
GFR: 83.5 mL/min (ref >60.00)
Glucose, Bld: 68 mg/dL — ABNORMAL LOW (ref 70–99)
Potassium: 4 mEq/L (ref 3.5–5.1)
Sodium: 134 mEq/L — ABNORMAL LOW (ref 135–145)
Total Bilirubin: 0.6 mg/dL (ref 0.2–1.2)
Total Protein: 7.6 g/dL (ref 6.0–8.3)

## 2021-03-30 NOTE — Progress Notes (Signed)
Cassandra Weber DOB: Jan 19, 1976 Encounter date: 03/30/2021  This is a 45 y.o. female who presents for complete physical   History of present illness/Additional concerns: Feeling well; but did just get back from [place]. She was there for 4 weeks. Family had gotten sick - got COVID so she went to check on them. Had some lower back pain after sitting in small seats on pane for 15 hours. Really was in pain during this time. No radiation of pain.   Hypothyroid: following with Dr. Everardo All. She is still on synthroid.  She did have thyroid enzymes checked last month while in [place] and they were normal.   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 No pertinent surgical history. No Known Allergies Current Meds  Medication Sig   levothyroxine (SYNTHROID) 25 MCG tablet Take 1 tablet (25 mcg total) by mouth daily before breakfast.   Social History   Tobacco Use   Smoking status: Never   Smokeless tobacco: Never  Substance Use Topics   Alcohol use: Never   Family History  Problem Relation Age of Onset   Diabetes Mother    Hearing loss Mother    Hearing loss Father        traumatic injury hearing loss   Stroke Father 77   Early death Maternal Grandfather 65   Stroke Maternal Grandfather        smoker   Stroke Paternal Grandfather 17   Healthy Sister    Thyroid disease Neg Hx      Review of Systems  Constitutional:  Negative for activity change, appetite change, chills, fatigue, fever and unexpected weight change.  HENT:  Negative for congestion, ear pain, hearing loss, sinus pressure, sinus pain, sore throat and trouble swallowing.   Eyes:  Negative for pain and visual disturbance.  Respiratory:  Negative for cough, chest  tightness, shortness of breath and wheezing.   Cardiovascular:  Negative for chest pain, palpitations and leg swelling.  Gastrointestinal:  Negative for abdominal pain, blood in stool, constipation, diarrhea, nausea and vomiting.  Genitourinary:  Negative for difficulty urinating and menstrual problem.  Musculoskeletal:  Negative for arthralgias and back pain.  Skin:  Negative for rash.  Neurological:  Negative for dizziness, weakness, numbness and headaches.  Hematological:  Negative for adenopathy. Does not bruise/bleed easily.  Psychiatric/Behavioral:  Negative for sleep disturbance and suicidal ideas. The patient is not nervous/anxious.    CBC:  Lab Results  Component Value Date   WBC 5.3 03/30/2021   HGB 12.1 03/30/2021   HCT 36.7 03/30/2021   MCH 26.6 (L) 04/28/2020   MCHC 32.9 03/30/2021   RDW 13.9 03/30/2021   PLT 224.0 03/30/2021   MPV 10.5 04/28/2020   CMP: Lab Results  Component Value Date   NA 134 (L) 03/30/2021   K 4.0 03/30/2021   CL 100 03/30/2021   CO2 28 03/30/2021   GLUCOSE 68 (L) 03/30/2021   BUN 12 03/30/2021   CREATININE 0.84 03/30/2021   CALCIUM 9.3 03/30/2021   PROT 7.6 03/30/2021   BILITOT 0.6 03/30/2021   ALKPHOS 38 (L) 03/30/2021   ALT 10 03/30/2021   AST 17 03/30/2021   LIPID: Lab Results  Component Value Date   CHOL 168 03/30/2021   TRIG 119.0 03/30/2021   HDL 52.40 03/30/2021   LDLCALC 92 03/30/2021    Objective:  BP 90/60 (BP Location: Left Arm, Patient Position: Sitting, Cuff Size: Normal)   Pulse 64   Temp 97.9 F (36.6 C) (Oral)   Ht 5' 5.25" (1.657 m)   Wt 126 lb 8 oz (57.4 kg)   LMP 03/20/2021 (Exact Date)   SpO2 99%   BMI 20.89 kg/m   Weight: 126 lb 8 oz (57.4 kg)   BP Readings from Last 3 Encounters:  03/30/21 90/60  10/11/20 100/60  09/03/20 94/64   Wt Readings from Last 3 Encounters:  03/30/21 126 lb 8 oz (57.4 kg)  10/11/20 127 lb 3.2 oz (57.7 kg)  09/03/20 127 lb 1.6 oz (57.7 kg)    Physical Exam Exam  conducted with a chaperone present.  Constitutional:      General: She is not in acute distress.    Appearance: She is well-developed.  HENT:     Head: Normocephalic and atraumatic.     Right Ear: External ear normal.     Left Ear: External ear normal.     Mouth/Throat:     Pharynx: No oropharyngeal exudate.  Eyes:     Conjunctiva/sclera: Conjunctivae normal.     Pupils: Pupils are equal, round, and reactive to light.  Neck:     Thyroid: No thyromegaly.  Cardiovascular:     Rate and Rhythm: Normal rate and regular rhythm.     Heart sounds: Normal heart sounds. No murmur heard.   No friction rub. No gallop.  Pulmonary:     Effort: Pulmonary effort is normal.     Breath sounds: Normal breath sounds.  Abdominal:     General: Bowel sounds are normal. There is no distension.     Palpations: Abdomen is soft. There is no mass.     Tenderness: There is no abdominal tenderness. There is no guarding.     Hernia: No hernia is present.  Genitourinary:    Exam position: Supine.     Vagina: Normal.     Cervix: Normal.     Uterus: Normal.      Adnexa: Right adnexa normal and left adnexa normal.  Musculoskeletal:        General: No tenderness or deformity. Normal range of motion.     Cervical back: Normal range of motion and neck supple.  Lymphadenopathy:     Cervical: No cervical adenopathy.  Skin:    General: Skin is warm and dry.     Findings: No rash.  Neurological:     Mental Status: She is alert and oriented to person, place, and time.     Deep Tendon Reflexes: Reflexes normal.     Reflex Scores:      Tricep reflexes are 2+ on the right side and 2+ on the left side.      Bicep reflexes are 2+ on the right side and 2+ on the left side.      Brachioradialis reflexes are 2+ on the right side and 2+ on the left side.      Patellar reflexes are 2+ on the right side and 2+ on the left side. Psychiatric:        Speech: Speech normal.        Behavior: Behavior normal.        Thought  Content: Thought content normal.    Assessment/Plan: Health Maintenance Due  Topic Date  Due   COLONOSCOPY (Pts 45-40yrs Insurance coverage will need to be confirmed)  Never done   INFLUENZA VACCINE  02/28/2021   Health Maintenance reviewed - she is up to date with preventative health care. I have requested mammogram from [facility] which is up to date per patient.   1. Preventative health care Keep up with healthy and active lifestyle.  2. Acquired hypothyroidism She will follow back up with endocrinology.  Per patient lab work was normal last month and UTI.  Continue with current dose of Synthroid. - CBC with Differential/Platelet; Future - CBC with Differential/Platelet  3. Lipid screening - Lipid panel; Future - Lipid panel  4. Screening for diabetes mellitus - Comprehensive metabolic panel; Future - Comprehensive metabolic panel  5. Cervical cancer screening - PAP [[place]]  Return in about 1 year (around 03/30/2022) for physical exam.  Theodis Shove, MD

## 2021-03-31 LAB — CYTOLOGY - PAP
Comment: NEGATIVE
Diagnosis: NEGATIVE
High risk HPV: NEGATIVE

## 2021-06-07 ENCOUNTER — Encounter: Payer: Self-pay | Admitting: Family Medicine

## 2021-07-05 IMAGING — US US THYROID
1 series · 13 of 25 positions shown · non-contrast
Comparison: Chest CT from 05/09/2020

CLINICAL DATA: Incidental on CT.

EXAM:
THYROID ULTRASOUND
TECHNIQUE: Ultrasound examination of the thyroid gland and adjacent soft
tissues was performed.

[Series 1: us thyroid · 0.04mm/px · 13 of 69 slices shown]
[im 1/69]
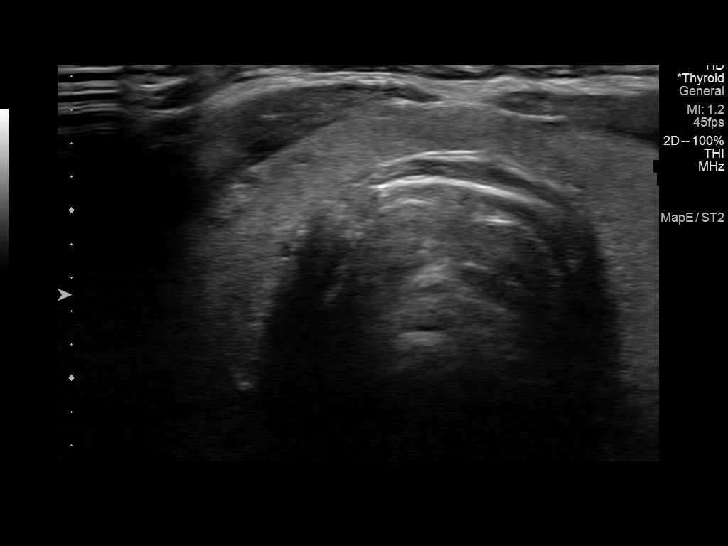
[im 6/69]
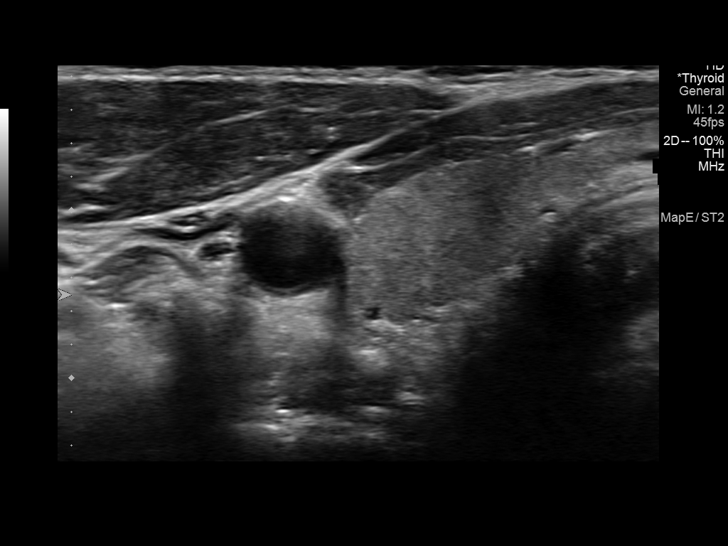
[im 12/69]
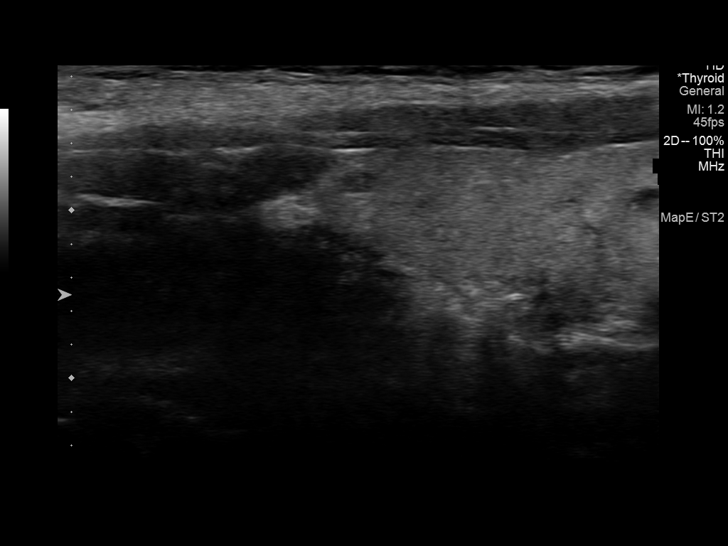
[im 18/69]
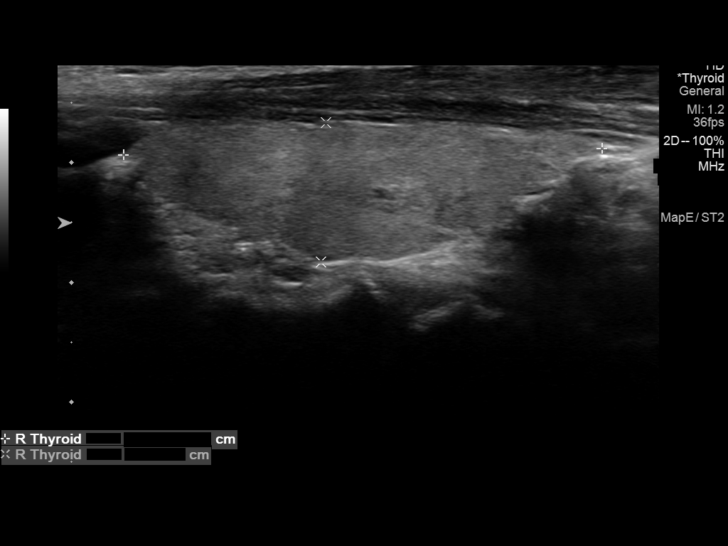
[im 23/69]
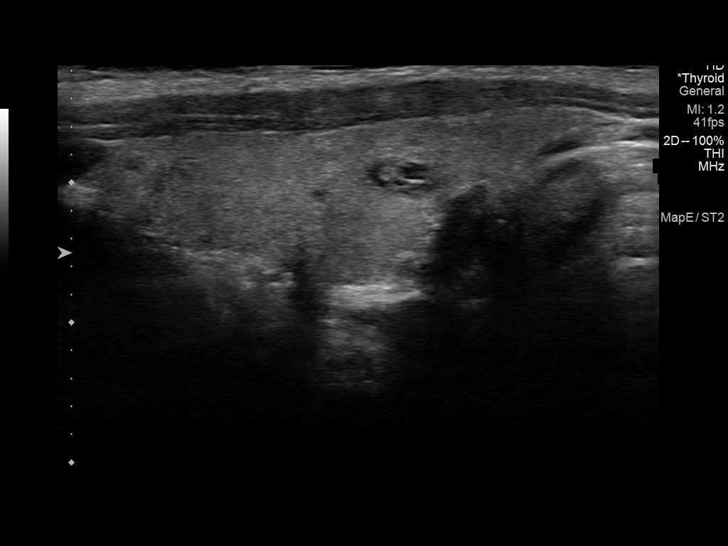
[im 29/69]
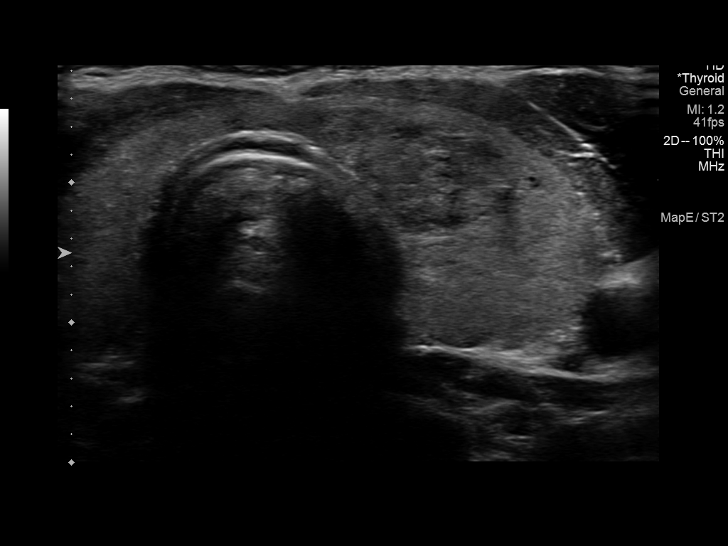
[im 35/69]
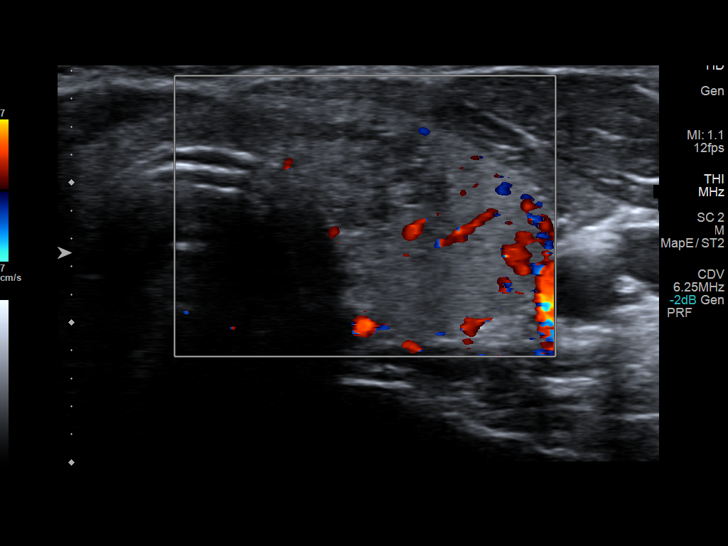
[im 40/69]
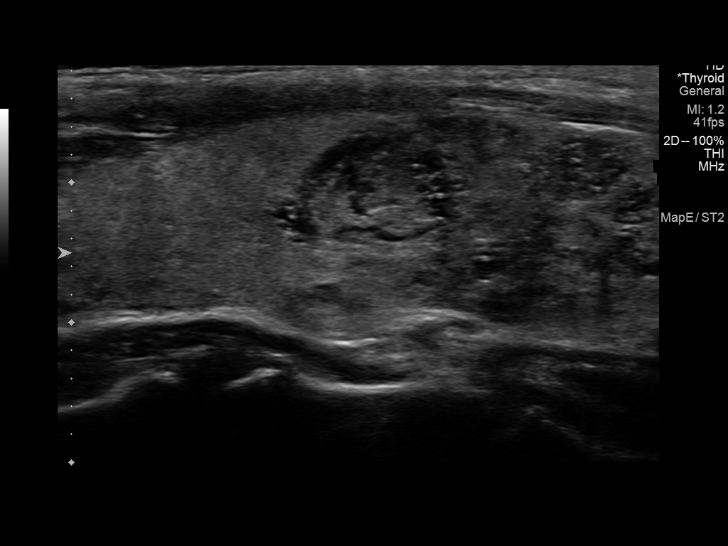
[im 46/69]
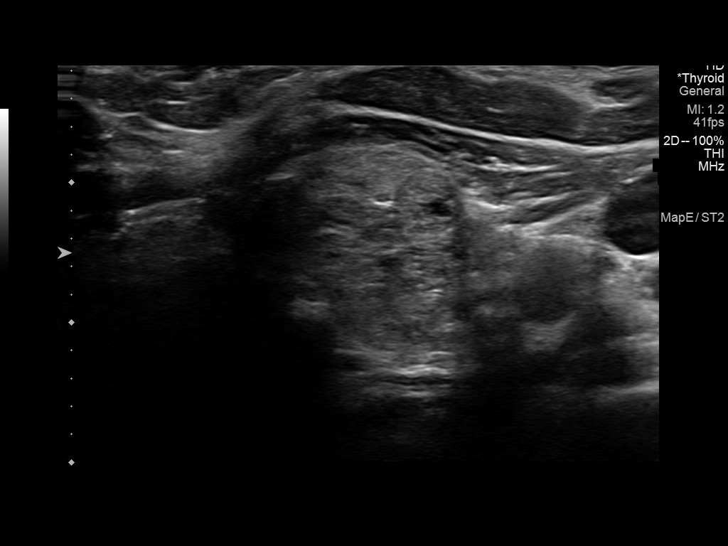
[im 52/69]
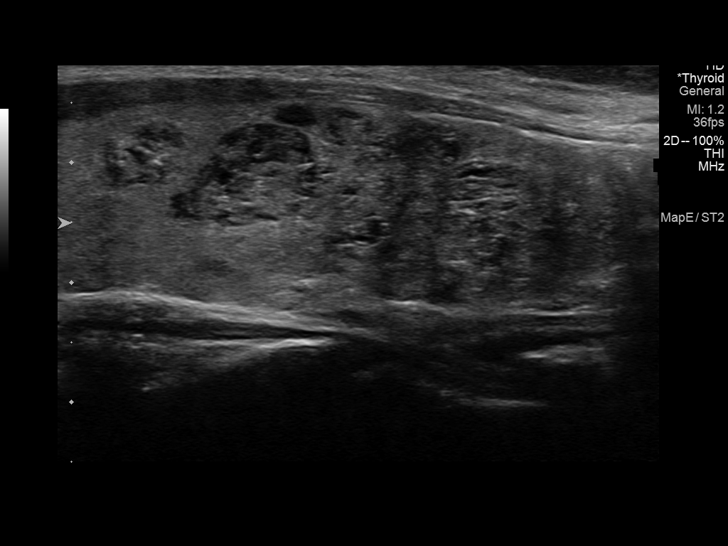
[im 57/69]
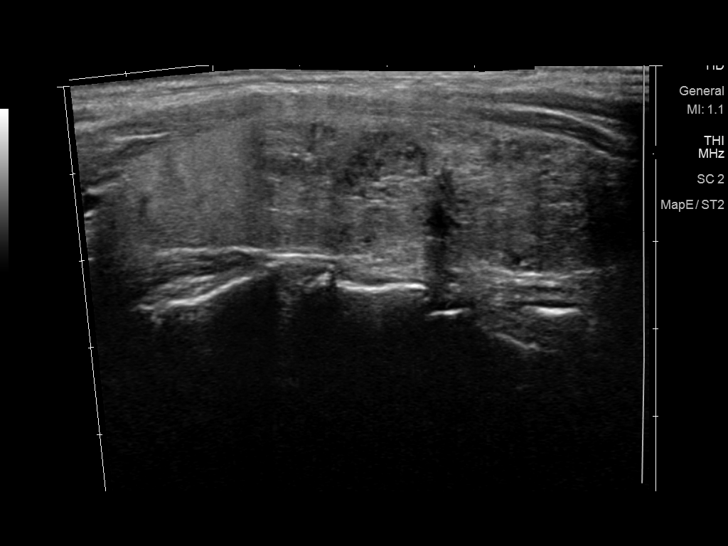
[im 63/69]
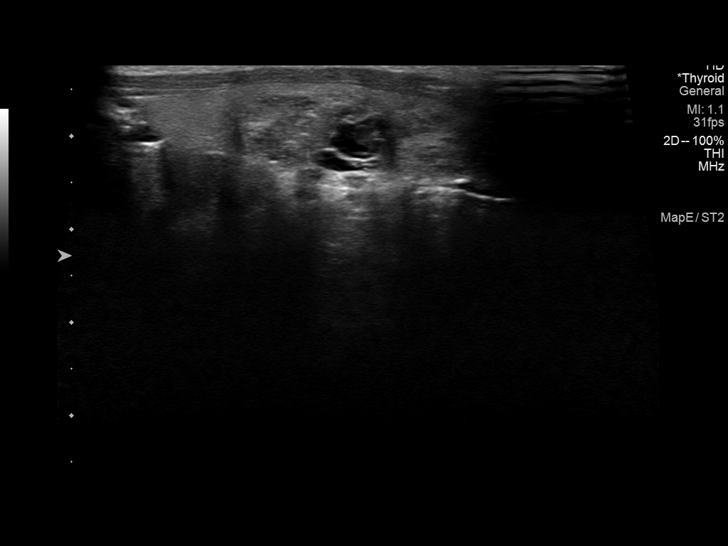
[im 69/69]
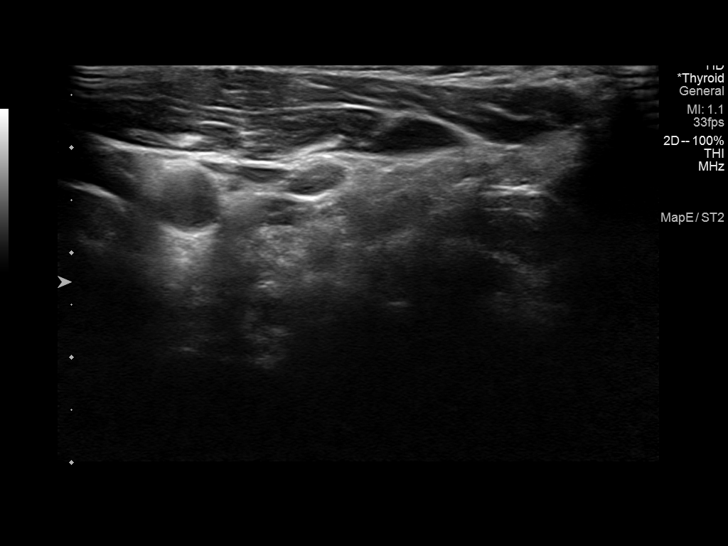

[13 of 25 positions shown; findings below may reference images not displayed]

FINDINGS: Parenchymal Echotexture: Mildly heterogenous

Isthmus: 0.3 cm

Right lobe: 4.0 x 1.2 x 1.2 cm

Left lobe: 6.2 x 1.7 x 1.9 cm

_________________________________________________________

Estimated total number of nodules >/= 1 cm: 2

Number of spongiform nodules >/=  2 cm not described below (TR1): 0

Number of mixed cystic and solid nodules >/= 1.5 cm not described
below (TR2): 0

_________________________________________________________

Nodule # 1:

Location: Left; Superior

Maximum size: 1.9 cm; Other 2 dimensions: 1.1 x 0.8 cm

Composition: mixed cystic and solid (1)

Echogenicity: hypoechoic (2)

Shape: not taller-than-wide (0)

Margins: ill-defined (0)

Echogenic foci: none (0)

ACR TI-RADS total points: 3.

ACR TI-RADS risk category: TR3 (3 points).

ACR TI-RADS recommendations:

*Given size (>/= 1.5 - 2.4 cm) and appearance, a follow-up
ultrasound in 1 year should be considered based on TI-RADS criteria.

_________________________________________________________

Nodule # 2:

Location: Left; Mid

Maximum size: 1.1 cm; Other 2 dimensions: 1.1 x 0.8 cm

Composition: spongiform (0)

Echogenicity: hypoechoic (2)

Shape: not taller-than-wide (0)

Margins: smooth (0)

Echogenic foci: none (0)

ACR TI-RADS total points: 2.

ACR TI-RADS risk category: TR2 (2 points).

ACR TI-RADS recommendations:

This nodule does NOT meet TI-RADS criteria for biopsy or dedicated
follow-up.

_________________________________________________________
IMPRESSION: Left superior thyroid nodule (labeled 1, 1.9 cm) meets criteria
(TI-RADS category 3) for 1 year ultrasound surveillance.

The above is in keeping with the ACR TI-RADS recommendations - [HOSPITAL] 9395;[DATE].

## 2021-07-21 LAB — HM MAMMOGRAPHY

## 2021-07-28 ENCOUNTER — Encounter: Payer: Self-pay | Admitting: Family Medicine

## 2021-08-28 IMAGING — CR DG CHEST 1V
1 series · 1 of 1 positions shown · non-contrast
Comparison: 12/16/2019, 05/26/2020, 04/29/2020, 07/16/2020

CLINICAL DATA: 44-year-old female follow-up TB treatment

EXAM:
CHEST  1 VIEW

[w chest pa]
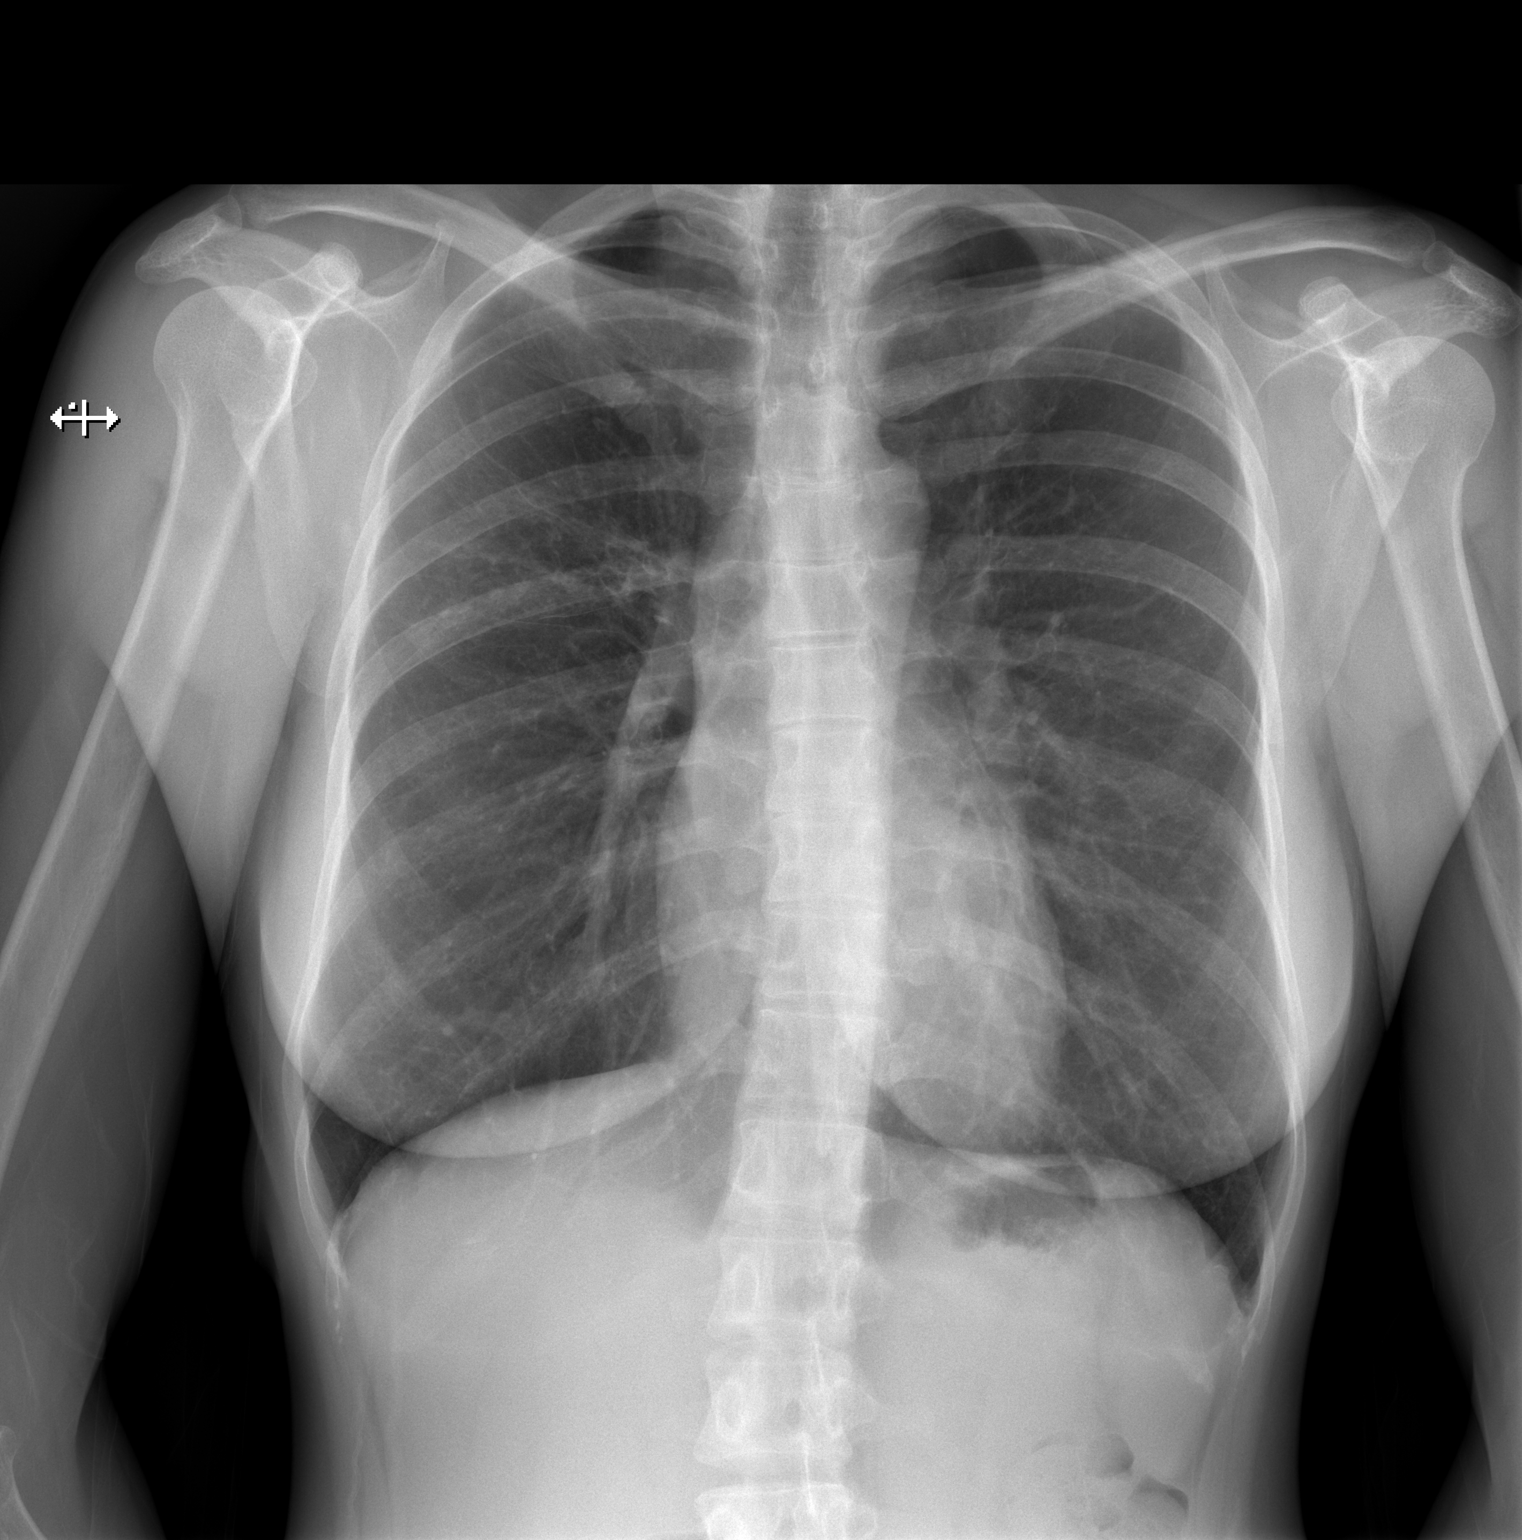

[1 of 1 positions shown; findings below may reference images not displayed]

FINDINGS: Cardiomediastinal silhouette unchanged in size and contour.

Linear opacities in the right suprahilar region, improving compared
to the prior. No new nodular opacity. No pneumothorax or pleural
effusion. No new confluent airspace disease.

Mild scoliotic curvature of the spine.
IMPRESSION: Improving right suprahilar opacity.

## 2021-08-31 ENCOUNTER — Other Ambulatory Visit: Payer: Self-pay

## 2021-08-31 ENCOUNTER — Ambulatory Visit
Admission: RE | Admit: 2021-08-31 | Discharge: 2021-08-31 | Disposition: A | Discharge status: Home / Self Care | Payer: No Typology Code available for payment source | Source: Ambulatory Visit | Attending: Family Medicine | Admitting: Family Medicine

## 2021-08-31 DIAGNOSIS — E041 Nontoxic single thyroid nodule: Secondary | ICD-10-CM

## 2022-05-04 ENCOUNTER — Ambulatory Visit (INDEPENDENT_AMBULATORY_CARE_PROVIDER_SITE_OTHER): Payer: No Typology Code available for payment source | Admitting: Family Medicine

## 2022-05-04 ENCOUNTER — Encounter: Payer: Self-pay | Admitting: Family Medicine

## 2022-05-04 VITALS — BP 82/58 | HR 70 | Temp 98.6°F | Ht 65.25 in | Wt 125.0 lb

## 2022-05-04 DIAGNOSIS — Z13220 Encounter for screening for lipoid disorders: Secondary | ICD-10-CM | POA: Diagnosis not present

## 2022-05-04 DIAGNOSIS — Z23 Encounter for immunization: Secondary | ICD-10-CM | POA: Diagnosis not present

## 2022-05-04 DIAGNOSIS — E042 Nontoxic multinodular goiter: Secondary | ICD-10-CM

## 2022-05-04 DIAGNOSIS — Z1211 Encounter for screening for malignant neoplasm of colon: Secondary | ICD-10-CM | POA: Diagnosis not present

## 2022-05-04 LAB — COMPREHENSIVE METABOLIC PANEL
ALT: 8 U/L (ref 0–35)
AST: 16 U/L (ref 0–37)
Albumin: 4.2 g/dL (ref 3.5–5.2)
Alkaline Phosphatase: 38 U/L — ABNORMAL LOW (ref 39–117)
BUN: 8 mg/dL (ref 6–23)
CO2: 26 mEq/L (ref 19–32)
Calcium: 8.9 mg/dL (ref 8.4–10.5)
Chloride: 103 mEq/L (ref 96–112)
Creatinine, Ser: 0.86 mg/dL (ref 0.40–1.20)
GFR: 80.56 mL/min (ref >60.00)
Glucose, Bld: 81 mg/dL (ref 70–99)
Potassium: 4.2 mEq/L (ref 3.5–5.1)
Sodium: 135 mEq/L (ref 135–145)
Total Bilirubin: 0.5 mg/dL (ref 0.2–1.2)
Total Protein: 7.6 g/dL (ref 6.0–8.3)

## 2022-05-04 LAB — TSH: TSH: 1.54 u[IU]/mL (ref 0.35–5.50)

## 2022-05-04 LAB — LIPID PANEL
Cholesterol: 171 mg/dL (ref 0–200)
HDL: 59.6 mg/dL (ref >39.00)
LDL Cholesterol: 92 mg/dL (ref 0–99)
NonHDL: 111.76
Total CHOL/HDL Ratio: 3
Triglycerides: 99 mg/dL (ref 0.0–149.0)
VLDL: 19.8 mg/dL (ref 0.0–40.0)

## 2022-05-04 NOTE — Assessment & Plan Note (Signed)
[place] results was reviewed with patient, she is not currently taking any medications, will check TSH today for surveillance.

## 2022-05-04 NOTE — Progress Notes (Signed)
All labs WNL, follow up in 1 year for annual physical

## 2022-05-04 NOTE — Progress Notes (Signed)
Established Patient Office Visit  Subjective   Patient ID: Cassandra Weber, female    DOB: 1975/11/07  Age: 46 y.o. MRN: [id number]  Chief Complaint  Patient presents with   Establish Care    Patient is here for transition of care visit.   Thyroid nodules-- patient states they were found incidentally on imaging. Patient reports no constipation, weight gain or excessive hair loss.  No other associated symptoms. We reviewed the ultrasound in the visit. Pt needs TSH chest today.  We reviewed her health maintenance, she is UTD on everything except colon cancer screening, she is agreeable to the cologard testing.    Patient Active Problem List   Diagnosis Date Noted   Multinodular goiter 10/11/2020      Review of Systems  All other systems reviewed and are negative.     Objective:     BP (!) 82/58 (BP Location: Left Arm, Patient Position: Sitting, Cuff Size: Normal)   Pulse 70   Temp 98.6 F (37 C) (Oral)   Ht 5' 5.25" (1.657 m)   Wt 125 lb (56.7 kg)   LMP 04/20/2022 (Approximate)   SpO2 98%   BMI 20.64 kg/m  BP Readings from Last 3 Encounters:  05/04/22 (!) 82/58  03/30/21 90/60  10/11/20 100/60      Physical Exam Vitals reviewed.  Constitutional:      Appearance: Normal appearance. She is well-groomed and normal weight.  HENT:     Head: Normocephalic and atraumatic.  Eyes:     Conjunctiva/sclera: Conjunctivae normal.  Neck:     Thyroid: No thyromegaly.  Cardiovascular:     Rate and Rhythm: Normal rate and regular rhythm.     Pulses: Normal pulses.     Heart sounds: S1 normal and S2 normal.  Pulmonary:     Effort: Pulmonary effort is normal.     Breath sounds: Normal breath sounds and air entry.  Abdominal:     General: Abdomen is flat. Bowel sounds are normal.  Musculoskeletal:        General: Normal range of motion.     Cervical back: Normal range of motion and neck supple.     Right lower leg: No edema.     Left lower leg: No edema.   Skin:    General: Skin is warm and dry.  Neurological:     Mental Status: She is alert and oriented to person, place, and time. Mental status is at baseline.     Gait: Gait is intact.  Psychiatric:        Mood and Affect: Mood and affect normal.        Speech: Speech normal.        Behavior: Behavior normal.        Judgment: Judgment normal.      No results found for any visits on 05/04/22.    The ASCVD Risk score (Arnett DK, et al., 2019) failed to calculate for the following reasons:   The valid systolic blood pressure range is 90 to 200 mmHg    Assessment & Plan:   Problem List Items Addressed This Visit       Endocrine   Multinodular goiter    [place] results was reviewed with patient, she is not currently taking any medications, will check TSH today for surveillance.       Relevant Orders   CMP   TSH   Other Visit Diagnoses     Need for immunization against influenza    -  Primary   Relevant Orders   Flu Vaccine QUAD 6+ mos PF IM (Fluarix Quad PF) (Completed)   Colon cancer screening       Relevant Orders   Cologuard   Lipid screening       Relevant Orders   Lipid Panel       Return in about 1 year (around 05/05/2023) for Annual Physical Exam.    Farrel Conners, MD

## 2022-05-25 LAB — COLOGUARD: COLOGUARD: NEGATIVE

## 2022-05-30 NOTE — Progress Notes (Signed)
Cologuard negative, follow up in 3 years

## 2022-06-19 IMAGING — US US THYROID
1 series · 13 of 25 positions shown · non-contrast
Comparison: 09/16/2020

CLINICAL DATA: Nodule

EXAM:
THYROID ULTRASOUND
TECHNIQUE: Ultrasound examination of the thyroid gland and adjacent soft
tissues was performed.

[Series 1: us thyroid · 0.06mm/px · 47 acquisitions, 13 frames shown]
[im 1/47]
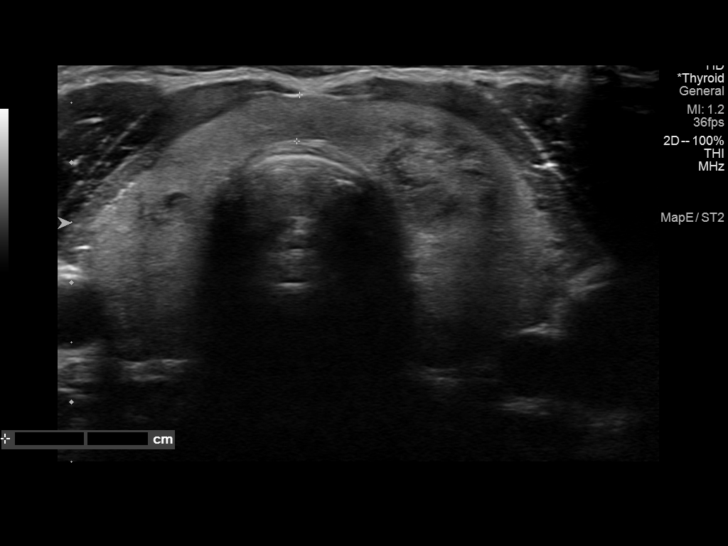
[im 4/47]
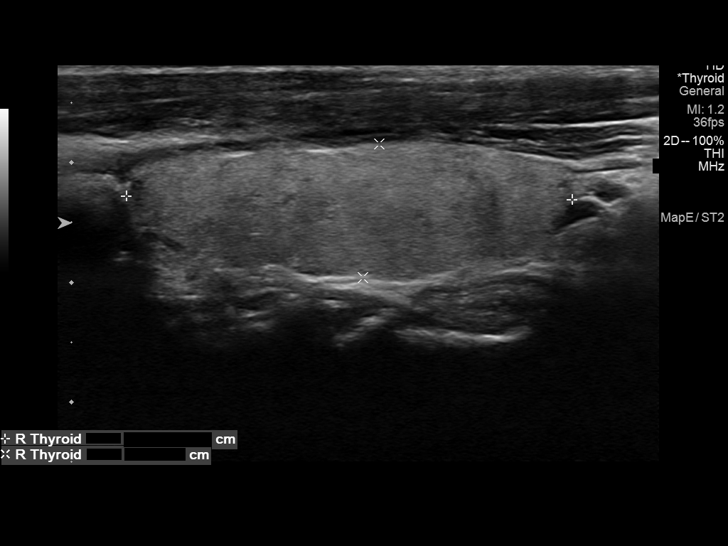
[im 8/47]
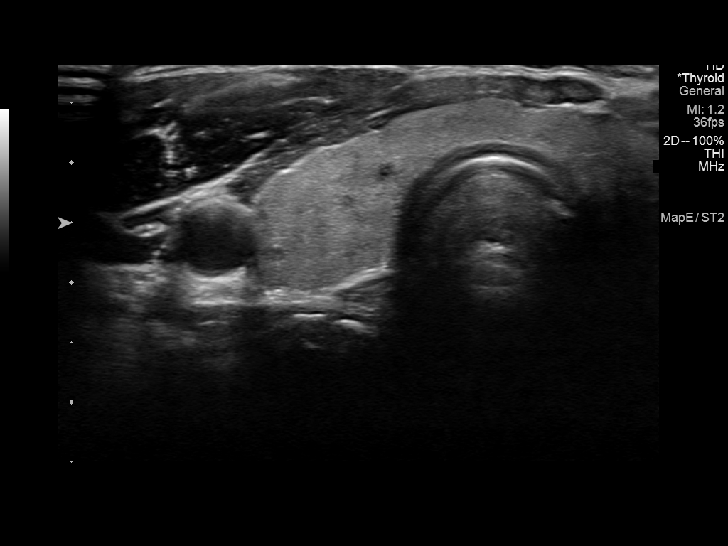
[im 12/47]
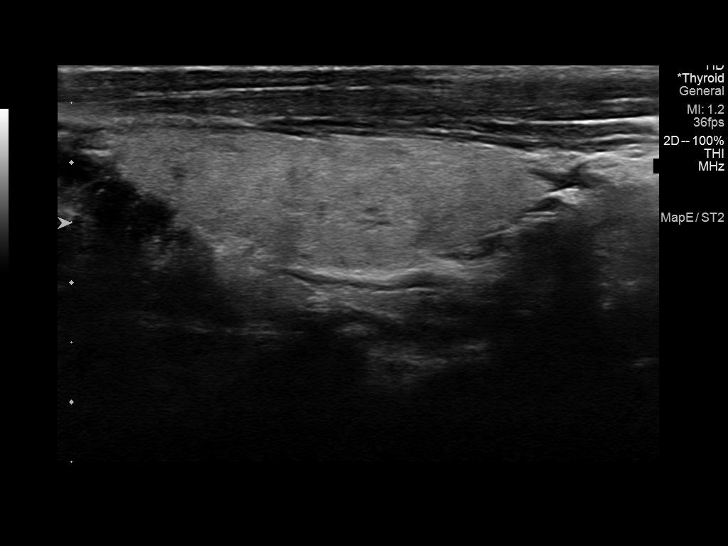
[im 16/47]
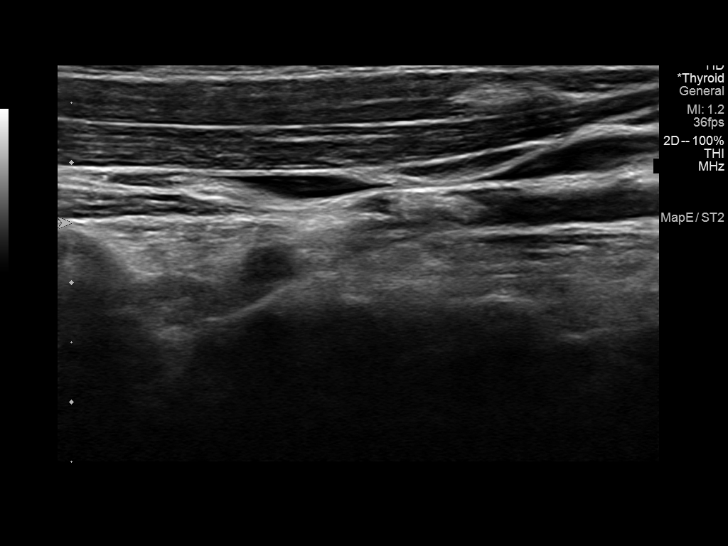
[im 20/47]
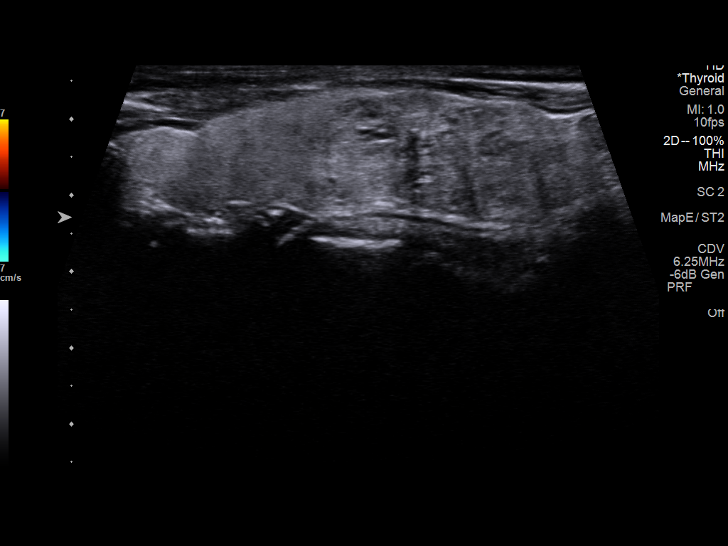
[im 24/47]
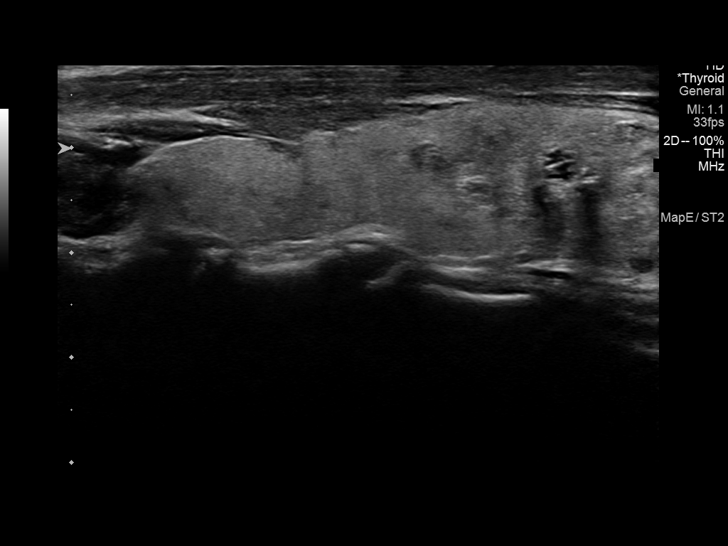
[im 27/47]
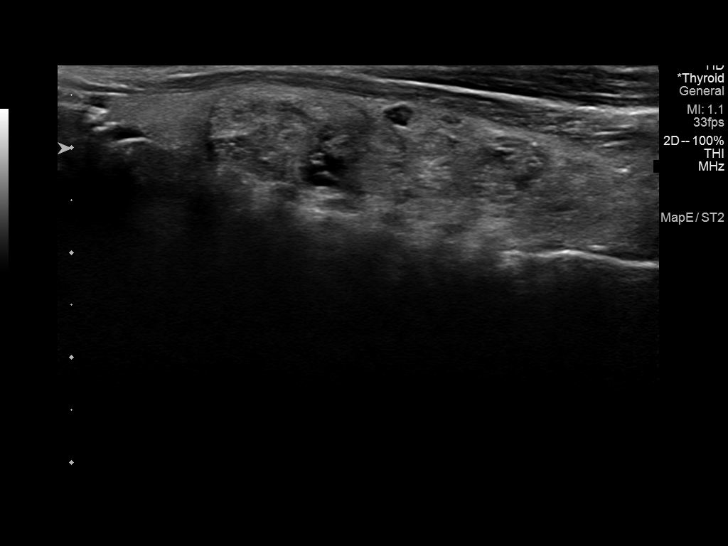
[im 31/47]
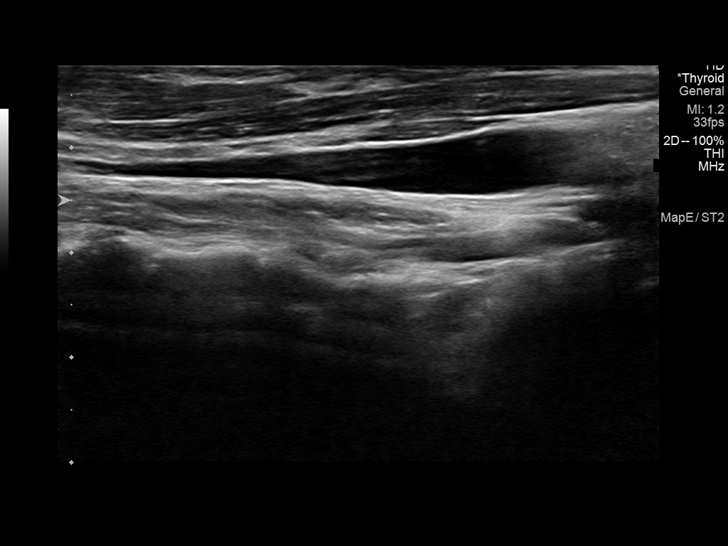
[im 35/47]
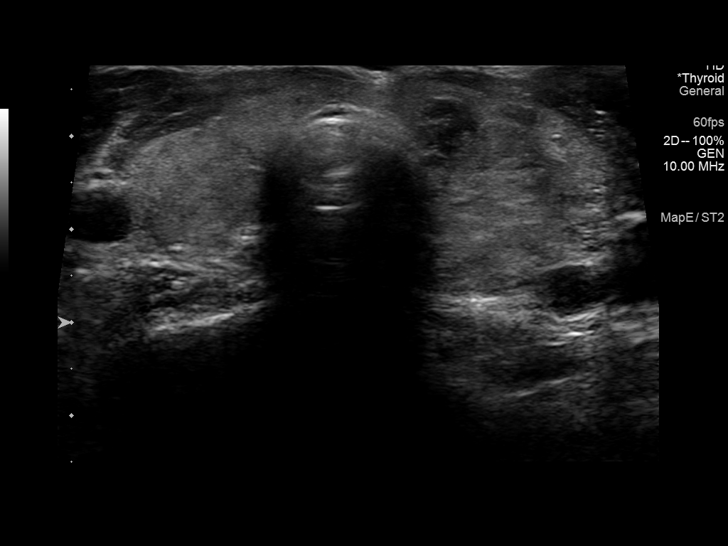
[im 39/47]
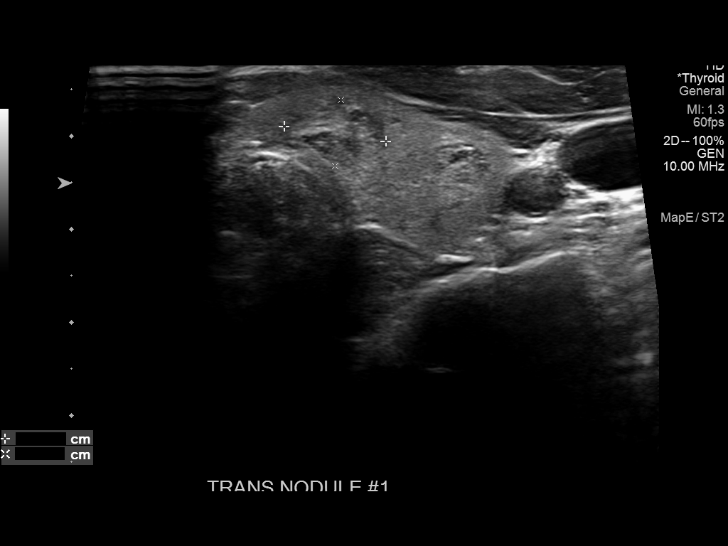
[im 43/47]
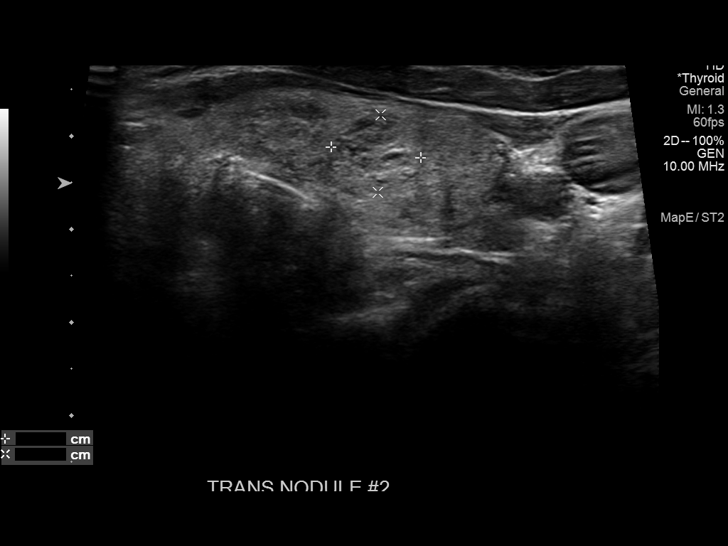
[im 47/47]
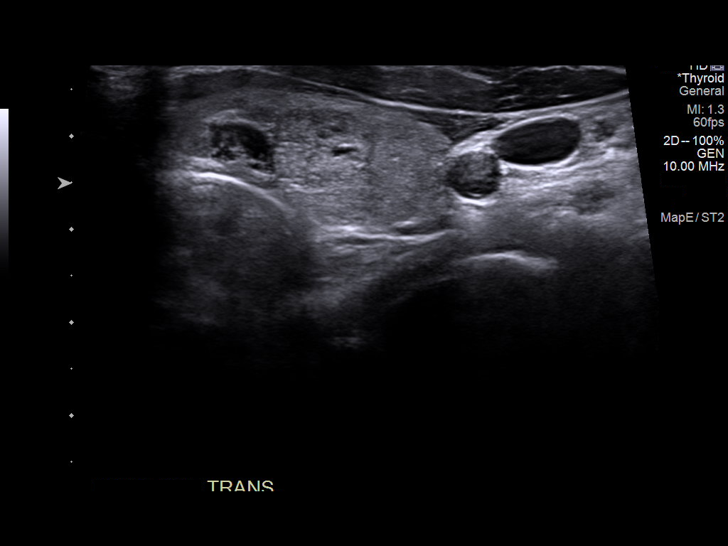

[13 of 25 positions shown; findings below may reference images not displayed]

FINDINGS: Parenchymal Echotexture: Moderately heterogenous

Isthmus: 0.4 cm thickness, previously

Right lobe: 3.7 x 1.1 x 1.2 cm, previously 4 x 1.2 x

Left lobe: 6.3 x 1.5 x 1.7 cm, previously 6.2 x 1.7 x

_________________________________________________________

Estimated total number of nodules >/= 1 cm: 2

Number of spongiform nodules >/=  2 cm not described below (TR1): 0

Number of mixed cystic and solid nodules >/= 1.5 cm not described
below (TR2): 0

_________________________________________________________

Nodule # 1:

Prior biopsy: No

Location: Left; superior

Maximum size: 1.7 cm; Other 2 dimensions: 1.1 x 0.7 cm, previously,
1.9 x 1.2 x 0.8 cm

Composition: mixed cystic and solid (1)

Echogenicity: isoechoic (1)

Shape: not taller-than-wide (0)

Margins: ill-defined (0)

Echogenic foci: none (0)

ACR TI-RADS total points: 2.

ACR TI-RADS risk category:  TR 2.

Significant change in size (>/= 20% in two dimensions and minimal
increase of 2 mm): No

Change in features: No

Change in ACR TI-RADS risk category: No

ACR TI-RADS recommendations:

This nodule does NOT meet TI-RADS criteria for biopsy or dedicated
follow-up.

_________________________________________________________

Nodule # 2:

Prior biopsy: No

Location: Left; mid

Maximum size: 1.2 cm; Other 2 dimensions: 1 x 0.8 cm, previously,
1.1 x 1.1 x 0.8 cm

Composition: mixed cystic and solid (1)

Echogenicity: hypoechoic (2)

Shape: not taller-than-wide (0)

Margins: ill-defined (0)

Echogenic foci: none (0)

ACR TI-RADS total points: 3.

ACR TI-RADS risk category:  TR 3.

Significant change in size (>/= 20% in two dimensions and minimal
increase of 2 mm): No

Change in features: No

Change in ACR TI-RADS risk category: No

ACR TI-RADS recommendations:

Given size (<1.4 cm) and appearance, this nodule does NOT meet
TI-RADS criteria for biopsy or dedicated follow-up.

_________________________________________________________

No regional cervical adenopathy.
IMPRESSION: 1. Asymmetric thyromegaly with stable left nodules. Neither meets
criteria for biopsy or follow-up.

The above is in keeping with the ACR TI-RADS recommendations - [HOSPITAL] 7348;[DATE].

## 2022-12-21 ENCOUNTER — Encounter: Payer: Self-pay | Admitting: Family Medicine

## 2022-12-28 ENCOUNTER — Ambulatory Visit (INDEPENDENT_AMBULATORY_CARE_PROVIDER_SITE_OTHER): Payer: No Typology Code available for payment source | Admitting: Family Medicine

## 2022-12-28 ENCOUNTER — Encounter: Payer: Self-pay | Admitting: Family Medicine

## 2022-12-28 VITALS — BP 100/58 | HR 69 | Temp 98.2°F | Ht 65.25 in | Wt 132.9 lb

## 2022-12-28 DIAGNOSIS — N6341 Unspecified lump in right breast, subareolar: Secondary | ICD-10-CM | POA: Diagnosis not present

## 2022-12-28 NOTE — Progress Notes (Signed)
   Acute Office Visit  Subjective:     Patient ID: Cassandra Weber, female    DOB: 06-13-1976, 47 y.o.   MRN: [id number]  Chief Complaint  Patient presents with   Breast Problem    Patient complains of right nipple "seems to be standing up and pricking sensation noted" and palpable of lump noted below areola x1 week    HPI Patient is in today for patient reports she noticed a new lump about a week ago on the right breast, moves freely, slight discomfort, located just under the nipple. No family history of breast cancer.   Review of Systems  All other systems reviewed and are negative.       Objective:    BP (!) 100/58 (BP Location: Left Arm, Patient Position: Sitting, Cuff Size: Normal)   Pulse 69   Temp 98.2 F (36.8 C) (Oral)   Ht 5' 5.25" (1.657 m)   Wt 132 lb 14.4 oz (60.3 kg)   LMP 12/11/2022 (Exact Date)   SpO2 100%   BMI 21.95 kg/m    Physical Exam Constitutional:      Appearance: Normal appearance. She is normal weight.  Chest:  Breasts:    Right: Mass (there is a round, 3 cm nodule in the right breast that is freely moveable just under the areola.) present. No inverted nipple, skin change or tenderness.     Left: Normal.  Lymphadenopathy:     Upper Body:     Right upper body: No axillary or pectoral adenopathy.     Left upper body: No axillary or pectoral adenopathy.  Neurological:     Mental Status: She is alert.     No results found for any visits on 12/28/22.      Assessment & Plan:   Problem List Items Addressed This Visit   None Visit Diagnoses     Subareolar mass of right breast    -  Primary   Relevant Orders   US BREAST COMPLETE UNI RIGHT INC AXILLA     Will order right breast US to better define the mass. Further orders TBD. Order will be sent to [facility] since this is where she has been getting her mammograms.   No orders of the defined types were placed in this encounter.   No follow-ups on file.  Cassandra Georges,  MD

## 2023-01-01 ENCOUNTER — Encounter: Payer: Self-pay | Admitting: Family Medicine

## 2023-01-01 LAB — HM MAMMOGRAPHY

## 2023-01-03 ENCOUNTER — Ambulatory Visit: Payer: No Typology Code available for payment source | Admitting: Family Medicine

## 2023-01-31 ENCOUNTER — Encounter: Payer: Self-pay | Admitting: Family Medicine

## 2023-01-31 ENCOUNTER — Ambulatory Visit (INDEPENDENT_AMBULATORY_CARE_PROVIDER_SITE_OTHER): Payer: No Typology Code available for payment source | Admitting: Family Medicine

## 2023-01-31 VITALS — BP 96/60 | HR 65 | Temp 97.8°F | Ht 65.75 in | Wt 130.7 lb

## 2023-01-31 DIAGNOSIS — Z0000 Encounter for general adult medical examination without abnormal findings: Secondary | ICD-10-CM | POA: Diagnosis not present

## 2023-01-31 DIAGNOSIS — E042 Nontoxic multinodular goiter: Secondary | ICD-10-CM | POA: Diagnosis not present

## 2023-01-31 DIAGNOSIS — D508 Other iron deficiency anemias: Secondary | ICD-10-CM

## 2023-01-31 LAB — CBC WITH DIFFERENTIAL/PLATELET
Basophils Absolute: 0 10*3/uL (ref 0.0–0.1)
Basophils Relative: 0.4 % (ref 0.0–3.0)
Eosinophils Absolute: 0.1 10*3/uL (ref 0.0–0.7)
Eosinophils Relative: 2.2 % (ref 0.0–5.0)
HCT: 38.1 % (ref 36.0–46.0)
Hemoglobin: 12.4 g/dL (ref 12.0–15.0)
Lymphocytes Relative: 38.5 % (ref 12.0–46.0)
Lymphs Abs: 1.9 10*3/uL (ref 0.7–4.0)
MCHC: 32.5 g/dL (ref 30.0–36.0)
MCV: 84.3 fl (ref 78.0–100.0)
Monocytes Absolute: 0.5 10*3/uL (ref 0.1–1.0)
Monocytes Relative: 10.1 % (ref 3.0–12.0)
Neutro Abs: 2.4 10*3/uL (ref 1.4–7.7)
Neutrophils Relative %: 48.8 % (ref 43.0–77.0)
Platelets: 255 10*3/uL (ref 150.0–400.0)
RBC: 4.52 Mil/uL (ref 3.87–5.11)
RDW: 14.6 % (ref 11.5–15.5)
WBC: 4.9 10*3/uL (ref 4.0–10.5)

## 2023-01-31 LAB — TSH: TSH: 2.15 u[IU]/mL (ref 0.35–5.50)

## 2023-01-31 NOTE — Progress Notes (Signed)
Complete physical exam  Patient: Cassandra Weber   DOB: 1975-08-06   47 y.o. Female  MRN: [id number]  Subjective:    Chief Complaint  Patient presents with   Annual Exam    Acsa Brandi is a 47 y.o. female who presents today for a complete physical exam. She reports consuming a general diet. Home exercise routine includes treadmill. She generally feels well. She reports sleeping well. She does not have additional problems to discuss today.    Most recent fall risk assessment:     No data to display           Most recent depression screenings:    01/31/2023    8:20 AM 05/04/2022   10:09 AM  PHQ 2/9 Scores  PHQ - 2 Score 0 0  PHQ- 9 Score  1    Vision:Within last year and Dental: No current dental problems and Receives regular dental care  Patient Active Problem List   Diagnosis Date Noted   Multinodular goiter 10/11/2020      Patient Care Team: Karie Georges, MD as PCP - General (Family Medicine)   Outpatient Medications Prior to Visit  Medication Sig   Ferrous Sulfate (IRON PO) Take by mouth. Fusion plus-once a day   fluconazole (DIFLUCAN) 200 MG tablet Take 200 mg by mouth once a week.   No facility-administered medications prior to visit.    Review of Systems  HENT:  Negative for hearing loss.   Eyes:  Negative for blurred vision.  Respiratory:  Negative for shortness of breath.   Cardiovascular:  Negative for chest pain.  Gastrointestinal: Negative.   Genitourinary: Negative.   Musculoskeletal:  Negative for back pain.  Neurological:  Negative for headaches.  Psychiatric/Behavioral:  Negative for depression.   All other systems reviewed and are negative.      Objective:     BP 96/60 (BP Location: Right Arm, Patient Position: Sitting, Cuff Size: Normal)   Pulse 65   Temp 97.8 F (36.6 C) (Oral)   Ht 5' 5.75" (1.67 m)   Wt 130 lb 11.2 oz (59.3 kg)   LMP 01/27/2023 (Exact Date)   SpO2 99%   BMI 21.26 kg/m    Physical  Exam Vitals reviewed.  Constitutional:      Appearance: Normal appearance. She is well-groomed and normal weight.  HENT:     Right Ear: Tympanic membrane and ear canal normal.     Left Ear: Tympanic membrane and ear canal normal.     Mouth/Throat:     Mouth: Mucous membranes are moist.     Pharynx: No posterior oropharyngeal erythema.  Eyes:     Conjunctiva/sclera: Conjunctivae normal.  Neck:     Thyroid: No thyromegaly.  Cardiovascular:     Rate and Rhythm: Normal rate and regular rhythm.     Pulses: Normal pulses.     Heart sounds: S1 normal and S2 normal.  Pulmonary:     Effort: Pulmonary effort is normal.     Breath sounds: Normal breath sounds and air entry.  Abdominal:     General: Abdomen is flat. Bowel sounds are normal.     Palpations: Abdomen is soft.  Musculoskeletal:     Right lower leg: No edema.     Left lower leg: No edema.  Lymphadenopathy:     Cervical: No cervical adenopathy.  Neurological:     Mental Status: She is alert and oriented to person, place, and time. Mental status is at baseline.  Gait: Gait is intact.  Psychiatric:        Mood and Affect: Mood and affect normal.        Speech: Speech normal.        Behavior: Behavior normal.        Judgment: Judgment normal.      No results found for any visits on 01/31/23.     Assessment & Plan:    Routine Health Maintenance and Physical Exam  Immunization History  Administered Date(s) Administered   Hepatitis B 02/23/2021, 03/29/2021   Influenza,inj,Quad PF,6+ Mos 05/04/2022   Influenza-Unspecified 03/29/2020, 07/14/2020, 03/29/2021   PFIZER(Purple Top)SARS-COV-2 Vaccination 09/12/2019, 10/04/2019, 02/23/2021   Tdap 03/29/2020    Health Maintenance  Topic Date Due   HIV Screening  05/05/2023 (Originally 02/09/1991)   COVID-19 Vaccine (4 - 2023-24 season) 01/31/2024 (Originally 03/31/2022)   INFLUENZA VACCINE  03/01/2023   MAMMOGRAM  01/01/2024   PAP SMEAR-Modifier  03/30/2024   Fecal DNA  (Cologuard)  05/17/2025   DTaP/Tdap/Td (2 - Td or Tdap) 03/29/2030   Hepatitis C Screening  Completed   HPV VACCINES  Aged Out    Discussed health benefits of physical activity, and encouraged her to engage in regular exercise appropriate for her age and condition.  Other iron deficiency anemia -     CBC with Differential/Platelet -     Iron, TIBC and Ferritin Panel  Multinodular goiter -     TSH  Routine general medical examination at a health care facility   Normal physical exam findings today, she is exercising and eating well, Counseled patient on pre-menopausal symptoms and ways to help this. We briefly discussed hormone replacement if her symptoms ever became severe.  Return in 1 year (on 01/31/2024).     Karie Georges, MD

## 2023-01-31 NOTE — Patient Instructions (Signed)

## 2023-02-01 LAB — IRON,TIBC AND FERRITIN PANEL
%SAT: 11 % (calc) — ABNORMAL LOW (ref 16–45)
Ferritin: 4 ng/mL — ABNORMAL LOW (ref 16–232)
Iron: 41 ug/dL (ref 40–190)
TIBC: 372 mcg/dL (calc) (ref 250–450)

## 2023-08-14 ENCOUNTER — Encounter: Payer: Self-pay | Admitting: Family Medicine

## 2023-08-14 LAB — HM MAMMOGRAPHY
